# Patient Record
Sex: Female | Born: 1955 | Race: White | Hispanic: No | Marital: Married | Smoking: Never smoker
Health system: Southern US, Community
[De-identification: ages and names within clinical notes are randomized; demographics above are authoritative.]

## PROBLEM LIST (undated history)

## (undated) DIAGNOSIS — R030 Elevated blood-pressure reading, without diagnosis of hypertension: Secondary | ICD-10-CM

## (undated) DIAGNOSIS — IMO0001 Reserved for inherently not codable concepts without codable children: Secondary | ICD-10-CM

## (undated) DIAGNOSIS — J309 Allergic rhinitis, unspecified: Secondary | ICD-10-CM

## (undated) DIAGNOSIS — B019 Varicella without complication: Secondary | ICD-10-CM

## (undated) DIAGNOSIS — E7800 Pure hypercholesterolemia, unspecified: Secondary | ICD-10-CM

## (undated) DIAGNOSIS — G43909 Migraine, unspecified, not intractable, without status migrainosus: Secondary | ICD-10-CM

## (undated) DIAGNOSIS — I10 Essential (primary) hypertension: Secondary | ICD-10-CM

## (undated) HISTORY — DX: Reserved for inherently not codable concepts without codable children: IMO0001

## (undated) HISTORY — DX: Elevated blood-pressure reading, without diagnosis of hypertension: R03.0

## (undated) HISTORY — DX: Varicella without complication: B01.9

## (undated) HISTORY — DX: Migraine, unspecified, not intractable, without status migrainosus: G43.909

## (undated) HISTORY — DX: Allergic rhinitis, unspecified: J30.9

## (undated) HISTORY — DX: Essential (primary) hypertension: I10

## (undated) HISTORY — DX: Pure hypercholesterolemia, unspecified: E78.00

---

## 1992-07-27 HISTORY — PX: TUBAL LIGATION: SHX77

## 1998-07-27 HISTORY — PX: AUGMENTATION MAMMAPLASTY: SUR837

## 1998-07-27 HISTORY — PX: BREAST ENHANCEMENT SURGERY: SHX7

## 2013-11-20 ENCOUNTER — Other Ambulatory Visit (HOSPITAL_COMMUNITY)
Admission: RE | Admit: 2013-11-20 | Discharge: 2013-11-20 | Disposition: A | Discharge status: Home / Self Care | Payer: BC Managed Care – PPO | Source: Ambulatory Visit | Attending: Family Medicine | Admitting: Family Medicine

## 2013-11-20 DIAGNOSIS — Z01419 Encounter for gynecological examination (general) (routine) without abnormal findings: Secondary | ICD-10-CM | POA: Insufficient documentation

## 2013-11-23 LAB — HM PAP SMEAR: HM Pap smear: NEGATIVE

## 2013-12-13 LAB — HM COLONOSCOPY

## 2014-01-09 ENCOUNTER — Other Ambulatory Visit: Payer: Self-pay | Admitting: Family

## 2014-01-09 DIAGNOSIS — Z1231 Encounter for screening mammogram for malignant neoplasm of breast: Secondary | ICD-10-CM

## 2015-08-28 ENCOUNTER — Ambulatory Visit (INDEPENDENT_AMBULATORY_CARE_PROVIDER_SITE_OTHER): Payer: 59 | Admitting: Family Medicine

## 2015-08-28 ENCOUNTER — Encounter: Payer: Self-pay | Admitting: Family Medicine

## 2015-08-28 VITALS — BP 148/98 | HR 65 | Temp 97.8°F | Ht 66.5 in | Wt 194.8 lb

## 2015-08-28 DIAGNOSIS — G44229 Chronic tension-type headache, not intractable: Secondary | ICD-10-CM | POA: Diagnosis not present

## 2015-08-28 DIAGNOSIS — I10 Essential (primary) hypertension: Secondary | ICD-10-CM | POA: Insufficient documentation

## 2015-08-28 DIAGNOSIS — Z8041 Family history of malignant neoplasm of ovary: Secondary | ICD-10-CM | POA: Diagnosis not present

## 2015-08-28 DIAGNOSIS — IMO0001 Reserved for inherently not codable concepts without codable children: Secondary | ICD-10-CM

## 2015-08-28 DIAGNOSIS — L988 Other specified disorders of the skin and subcutaneous tissue: Secondary | ICD-10-CM | POA: Diagnosis not present

## 2015-08-28 DIAGNOSIS — R030 Elevated blood-pressure reading, without diagnosis of hypertension: Secondary | ICD-10-CM

## 2015-08-28 NOTE — Patient Instructions (Signed)
Nice to meet you. We will check lab work today. We will refer you to gynecology for further evaluation given your family history of cancer.  I would like for you to check your blood pressure daily and keep a log. You will return in 1 week for a nurse visit for blood pressure check.  If you develop persistent headaches, changing headaches, numbness, weakness, vision changes, chest pain, shortness of breath, palpitations, or any new or change in symptoms please seek medical attention.

## 2015-08-28 NOTE — Assessment & Plan Note (Signed)
Headache history consistent with chronic and intermittent tension headaches. Neurologically intact. Unchanged in recent years. Resolves with Excedrin. Discussed that these are likely tension headaches. We'll continue to monitor. Given return precautions.

## 2015-08-28 NOTE — Progress Notes (Signed)
Patient ID: Nichole Wilkins, female   DOB: 23-May-1956, 60 y.o.   MRN: [id number]  Marikay Alar, MD Phone: [phone]  Nichole Wilkins is a 60 y.o. female who presents today for new patient visit.  Elevated blood pressure: Patient notes blood pressure has been elevated intermittently for the last year. It's her blood pressure was never greater than 140/90. She has not checked it in several months. She not ever been on medications for this. She denies chest pain, shortness of breath, and edema.  She notes intermittent headaches that are tension in nature. Occurs when she is stressed. They are a dull ache above her right eye. It goes away easily with Excedrin. Do not wake her from sleep. No recent changes in the headaches. She has a history of migraines though has not had one in 3-4 years. Denies numbness, weakness, and vision changes.  She notes some stressors with her family life. She notes her sister was just diagnosed with ovarian cancer. Her mom had vulvar cancer in the past. Her grandmother died of some kind of cancer. She also notes her father had an MI in his 56s and her mother had an MI in her 14s. She denies depression and anxiety.  She notes a polyp in the left inner aspect of her mouth. It has been there about a year. She saw a dentist previously for a broken tooth and said that the polyp should go down with fixing the tooth. It has not improved. It has not grown. It does not cause any pain.  Active Ambulatory Problems    Diagnosis Date Noted  . Elevated blood pressure (not hypertension) 08/28/2015  . Chronic tension headaches 08/28/2015  . Family history of ovarian cancer 08/28/2015  . Polyp of skin 08/28/2015   Resolved Ambulatory Problems    Diagnosis Date Noted  . No Resolved Ambulatory Problems   Past Medical History  Diagnosis Date  . Chickenpox   . Allergic rhinitis   . Elevated blood pressure   . High cholesterol   . Migraines     Family History  Problem  Relation Age of Onset  . Ovarian cancer Sister   . Cancer - Other Mother     Vulvar   . Cancer - Other Maternal Grandmother     Unknown  . Stroke      Parent  . Heart attack Father     Age 58  . High blood pressure      Parent, other relative    Social History   Social History  . Marital Status: Married    Spouse Name: N/A  . Number of Children: N/A  . Years of Education: N/A   Occupational History  . Not on file.   Social History Main Topics  . Smoking status: Never Smoker   . Smokeless tobacco: Not on file  . Alcohol Use: No  . Drug Use: No  . Sexual Activity: Not on file   Other Topics Concern  . Not on file   Social History Narrative  . No narrative on file    ROS   General:  Negative for nexplained weight loss, fever Skin: Negative for new or changing mole, sore that won't heal HEENT: Negative for trouble hearing, trouble seeing, ringing in ears, mouth sores, hoarseness, change in voice, dysphagia. CV:  Negative for chest pain, dyspnea, edema, palpitations Resp: Negative for cough, dyspnea, hemoptysis GI: Positive for constipation, Negative for nausea, vomiting, diarrhea,  abdominal pain, melena, hematochezia. GU: Negative for dysuria, incontinence,  urinary hesitance, hematuria, vaginal or penile discharge, polyuria, sexual difficulty, lumps in testicle or breasts MSK: Positive for joint aches, Negative for muscle cramps or aches, joint swelling Neuro: Positive for headaches, Negative for weakness, numbness, dizziness, passing out/fainting Psych: Positive for stress, Negative for depression, anxiety, memory problems  Objective  Physical Exam Filed Vitals:   08/28/15 0829  BP: 148/98  Pulse: 65  Temp: 97.8 F (36.6 C)    BP Readings from Last 3 Encounters:  08/28/15 148/98   Wt Readings from Last 3 Encounters:  08/28/15 194 lb 12.8 oz (88.361 kg)    Physical Exam  Constitutional: She is well-developed, well-nourished, and in no distress.    HENT:  Head: Normocephalic and atraumatic.  Right Ear: External ear normal.  Left Ear: External ear normal.  Mouth/Throat: Oropharynx is clear and moist. No oropharyngeal exudate.  Eyes: Conjunctivae are normal. Pupils are equal, round, and reactive to light.  Neck: Neck supple.  Cardiovascular: Normal rate, regular rhythm and normal heart sounds.  Exam reveals no gallop and no friction rub.   No murmur heard. Pulmonary/Chest: Effort normal and breath sounds normal. No respiratory distress. She has no wheezes. She has no rales.  Abdominal: Soft. Bowel sounds are normal. She exhibits no distension. There is no tenderness. There is no rebound and no guarding.  Musculoskeletal: She exhibits no edema.  Lymphadenopathy:    She has no cervical adenopathy.  Neurological: She is alert.  CN 2-12 intact, 5/5 strength in bilateral biceps, triceps, grip, quads, hamstrings, plantar and dorsiflexion, sensation to light touch intact in bilateral UE and LE, normal gait, 2+ patellar reflexes  Skin: Skin is warm and dry. She is not diaphoretic.  Psychiatric: Mood and affect normal.     Assessment/Plan:   Elevated blood pressure (not hypertension) Blood pressure elevated today in the office. She reports over the last year it has been elevated at home, though never in the hypertensive range. She states never greater than 140/90 and notes that it was typically much lower than this though she is unable to give me exact numbers. I discussed that a single elevated blood pressure in the office did not necessarily indicate hypertension. Discussed that we would need to have multiple readings that revealed this type of blood pressure prior starting her on medication. She will check it at home and record it. She will return in 1 week for nurse visit for recheck. I advised that if it were consistently over 140/90 at home in the next several days she should call us and let us know and we can start on medication. She will  additionally return in 1 week for lab work as outlined below. If BP greater than 180/110 she will call immediately. She is given return precautions.  Chronic tension headaches Headache history consistent with chronic and intermittent tension headaches. Neurologically intact. Unchanged in recent years. Resolves with Excedrin. Discussed that these are likely tension headaches. We'll continue to monitor. Given return precautions.  Family history of ovarian cancer Notes strong family history of gynecologic cancer. Also with an unknown type of cancer in her maternal grandmother. Given this we will refer to gynecology for consideration of genetic testing for cancer risk.  Polyp of skin Polyp in left oral cavity. Has been evaluated by a dentist previously. Advised patient to return to dentist for possible removal and pathology.    Orders Placed This Encounter  Procedures  . Lipid Profile    Standing Status: Future     Number of  Occurrences:      Standing Expiration Date: 08/27/2016  . HgB A1c    Standing Status: Future     Number of Occurrences:      Standing Expiration Date: 08/27/2016  . Comp Met (CMET)    Standing Status: Future     Number of Occurrences:      Standing Expiration Date: 08/27/2016  . TSH    Standing Status: Future     Number of Occurrences:      Standing Expiration Date: 08/27/2016  . CBC    Standing Status: Future     Number of Occurrences:      Standing Expiration Date: 08/27/2016  . Ambulatory referral to Gynecology    Referral Priority:  Routine    Referral Type:  Consultation    Referral Reason:  Specialty Services Required    Requested Specialty:  Gynecology    Number of Visits Requested:  1    No orders of the defined types were placed in this encounter.     Tommi Rumps

## 2015-08-28 NOTE — Assessment & Plan Note (Signed)
Polyp in left oral cavity. Has been evaluated by a dentist previously. Advised patient to return to dentist for possible removal and pathology.

## 2015-08-28 NOTE — Progress Notes (Signed)
Pre visit review using our clinic review tool, if applicable. No additional management support is needed unless otherwise documented below in the visit note. 

## 2015-08-28 NOTE — Assessment & Plan Note (Signed)
Blood pressure elevated today in the office. She reports over the last year it has been elevated at home, though never in the hypertensive range. She states never greater than 140/90 and notes that it was typically much lower than this though she is unable to give me exact numbers. I discussed that a single elevated blood pressure in the office did not necessarily indicate hypertension. Discussed that we would need to have multiple readings that revealed this type of blood pressure prior starting her on medication. She will check it at home and record it. She will return in 1 week for nurse visit for recheck. I advised that if it were consistently over 140/90 at home in the next several days she should call us and let us know and we can start on medication. She will additionally return in 1 week for lab work as outlined below. If BP greater than 180/110 she will call immediately. She is given return precautions.

## 2015-08-28 NOTE — Assessment & Plan Note (Signed)
Notes strong family history of gynecologic cancer. Also with an unknown type of cancer in her maternal grandmother. Given this we will refer to gynecology for consideration of genetic testing for cancer risk.

## 2015-09-11 ENCOUNTER — Ambulatory Visit (INDEPENDENT_AMBULATORY_CARE_PROVIDER_SITE_OTHER): Payer: 59

## 2015-09-11 ENCOUNTER — Other Ambulatory Visit (INDEPENDENT_AMBULATORY_CARE_PROVIDER_SITE_OTHER): Payer: 59

## 2015-09-11 VITALS — BP 142/80 | HR 67 | Resp 18

## 2015-09-11 DIAGNOSIS — IMO0001 Reserved for inherently not codable concepts without codable children: Secondary | ICD-10-CM

## 2015-09-11 DIAGNOSIS — R030 Elevated blood-pressure reading, without diagnosis of hypertension: Secondary | ICD-10-CM

## 2015-09-11 LAB — COMPREHENSIVE METABOLIC PANEL
ALT: 20 U/L (ref 0–35)
AST: 23 U/L (ref 0–37)
Albumin: 4.2 g/dL (ref 3.5–5.2)
Alkaline Phosphatase: 64 U/L (ref 39–117)
BUN: 14 mg/dL (ref 6–23)
CO2: 27 mEq/L (ref 19–32)
Calcium: 9.3 mg/dL (ref 8.4–10.5)
Chloride: 106 mEq/L (ref 96–112)
Creatinine, Ser: 0.88 mg/dL (ref 0.40–1.20)
GFR: 70.13 mL/min (ref >60.00)
GLUCOSE: 86 mg/dL (ref 70–99)
Potassium: 4.1 mEq/L (ref 3.5–5.1)
Sodium: 140 mEq/L (ref 135–145)
Total Bilirubin: 0.3 mg/dL (ref 0.2–1.2)
Total Protein: 7.2 g/dL (ref 6.0–8.3)

## 2015-09-11 LAB — CBC
HEMATOCRIT: 38.5 % (ref 36.0–46.0)
HEMOGLOBIN: 13 g/dL (ref 12.0–15.0)
MCHC: 33.8 g/dL (ref 30.0–36.0)
MCV: 92.7 fl (ref 78.0–100.0)
PLATELETS: 245 10*3/uL (ref 150.0–400.0)
RBC: 4.15 Mil/uL (ref 3.87–5.11)
RDW: 13.2 % (ref 11.5–15.5)
WBC: 5.7 10*3/uL (ref 4.0–10.5)

## 2015-09-11 LAB — LIPID PANEL
Cholesterol: 208 mg/dL — ABNORMAL HIGH (ref 0–200)
HDL: 52.3 mg/dL (ref >39.00)
LDL CALC: 138 mg/dL — AB (ref 0–99)
NonHDL: 155.79
Total CHOL/HDL Ratio: 4
Triglycerides: 87 mg/dL (ref 0.0–149.0)
VLDL: 17.4 mg/dL (ref 0.0–40.0)

## 2015-09-11 LAB — TSH: TSH: 1.56 u[IU]/mL (ref 0.35–4.50)

## 2015-09-11 LAB — HEMOGLOBIN A1C: Hgb A1c MFr Bld: 5.5 % (ref 4.6–6.5)

## 2015-09-11 MED ORDER — AMLODIPINE BESYLATE 5 MG PO TABS: 5.0000 mg | ORAL_TABLET | Freq: Every day | ORAL | Status: DC

## 2015-09-11 NOTE — Addendum Note (Signed)
Addended by: Glori Luis on: 09/11/2015 04:55 PM   Modules accepted: Orders

## 2015-09-11 NOTE — Progress Notes (Signed)
Patient came in for BP check one week post office visit.  Per the patient she is checking BP at home, at random times, readings have been anywhere from 140-93 to 142/80 at home.  Checked vitals in bilateral extremities.  See vitals for details.  Please advise.

## 2015-09-11 NOTE — Progress Notes (Signed)
Patient ID: Nichole Wilkins, female   DOB: 1956-05-29, 60 y.o.   MRN: [id number] Amlodipine 5 mg daily sent to the pharmacy. Patient should monitor blood pressure at home on this. If it is less than 100/60 she should let us know.

## 2015-09-11 NOTE — Progress Notes (Signed)
Patient here today for lab draw only. 

## 2015-09-11 NOTE — Progress Notes (Signed)
Patient ID: Nichole Wilkins, female   DOB: 1956/06/27, 60 y.o.   MRN: [id number] Patient blood pressure still appears to be above goal slightly. There are 2 options. She needs to work on diet and exercise. This needs to occur regardless of being on medication. The other option is to start medication as well as work on diet and exercise. We would likely start low dose amlodipine. Please see what she would like to do.

## 2015-10-04 ENCOUNTER — Ambulatory Visit (INDEPENDENT_AMBULATORY_CARE_PROVIDER_SITE_OTHER): Payer: 59 | Admitting: Family Medicine

## 2015-10-04 ENCOUNTER — Encounter: Payer: Self-pay | Admitting: Family Medicine

## 2015-10-04 VITALS — BP 124/82 | HR 69 | Temp 97.5°F | Ht 66.5 in | Wt 187.0 lb

## 2015-10-04 DIAGNOSIS — R21 Rash and other nonspecific skin eruption: Secondary | ICD-10-CM | POA: Diagnosis not present

## 2015-10-04 DIAGNOSIS — E785 Hyperlipidemia, unspecified: Secondary | ICD-10-CM | POA: Insufficient documentation

## 2015-10-04 DIAGNOSIS — I10 Essential (primary) hypertension: Secondary | ICD-10-CM | POA: Diagnosis not present

## 2015-10-04 MED ORDER — HYDROCHLOROTHIAZIDE 25 MG PO TABS: 12.5000 mg | ORAL_TABLET | Freq: Every day | ORAL | Status: DC

## 2015-10-04 MED ORDER — TRIAMCINOLONE ACETONIDE 0.1 % EX CREA: 1.0000 "application " | TOPICAL_CREAM | Freq: Two times a day (BID) | CUTANEOUS | Status: DC | PRN

## 2015-10-04 NOTE — Patient Instructions (Signed)
Nice to see you. You blood pressure is doing great.  We will change your blood pressure medication given the rash you have developed. We will start you on HCTZ 12.5 mg daily.  Please use the steroid ointment for the rash.  If the rash spreads, you develop fever, pain, do not feel well, or any new or change in symptoms please seek medical attention.

## 2015-10-04 NOTE — Assessment & Plan Note (Signed)
Patient with a rash onset after starting amlodipine. No other potential causes identified. No systemic symptoms. Vital signs are stable. We will change her amlodipine and hydrochlorothiazide. She will stop amlodipine. We will treat the rash with triamcinolone ointment. She'll continue to monitor. She's given return precautions.

## 2015-10-04 NOTE — Assessment & Plan Note (Addendum)
Blood pressure elevated on multiple checks about a month and a half ago. Patient was started on amlodipine. Blood pressure is now in the normal range. Rash could be related to amlodipine. Thus we will change to hydrochlorothiazide and stop the amlodipine. Continue to monitor her blood pressure at home. She will return in one month for blood pressure recheck and lab work.

## 2015-10-04 NOTE — Assessment & Plan Note (Signed)
Mildly elevated total cholesterol and LDL cholesterol previously. Discussed diet and exercise at length with patient. She will continue to work on diet and to exercise daily. We'll plan to recheck her cholesterol in one month.

## 2015-10-04 NOTE — Progress Notes (Signed)
Pre visit review using our clinic review tool, if applicable. No additional management support is needed unless otherwise documented below in the visit note. 

## 2015-10-04 NOTE — Progress Notes (Signed)
Patient ID: Nichole Wilkins, female   DOB: 07/09/1956, 60 y.o.   MRN: [id number]  Marikay AlarEric Jaelynne Hockley, MD Phone: [phone]  Nichole IgoSandra L Wilkins is a 60 y.o. female who presents today for follow-up.  HYPERTENSION Disease Monitoring Home BP Monitoring checking, patient reports they're in the normal range, unable to give specific numbers Chest pain- no    Dyspnea- no Medications Compliance-  taking amlodipine 5 mg daily.  Edema- no  HYPERLIPIDEMIA Disease Monitoring: See symptoms for Hypertension Currently diet controlled. She is working on cutting back on fried foods and is exercising by walking on the treadmill 35 minutes a day at almost a running paste.  Rash: Patient notes rash over her upper chest and shoulders for the last 2-3 weeks. Notes this started about a week after starting on amlodipine. She notes it itches. It has not spread. She has not tried any medications for it. No fevers. No contacts with this. No travel. Notes only new medication is amlodipine. Notes no new soaps or detergents. Notes it has not improved. No rash elsewhere.   PMH: nonsmoker.   ROS see HPI  Objective  Physical Exam Filed Vitals:   10/04/15 0801  BP: 124/82  Pulse: 69  Temp: 97.5 F (36.4 C)    BP Readings from Last 3 Encounters:  10/04/15 124/82  09/11/15 142/80  08/28/15 148/98   Wt Readings from Last 3 Encounters:  10/04/15 187 lb (84.823 kg)  08/28/15 194 lb 12.8 oz (88.361 kg)    Physical Exam  Constitutional: She is well-developed, well-nourished, and in no distress.  HENT:  Head: Normocephalic and atraumatic.  Right Ear: External ear normal.  Left Ear: External ear normal.  Mouth/Throat: No oropharyngeal exudate.  Cardiovascular: Normal rate, regular rhythm and normal heart sounds.  Exam reveals no gallop and no friction rub.   No murmur heard. Pulmonary/Chest: Effort normal and breath sounds normal. No respiratory distress. She has no wheezes. She has no rales.    Neurological: She is alert. Gait normal.  Skin: Skin is warm and dry. She is not diaphoretic.     Erythematous papules with excoriations noted over upper chest with minimal area of rash on her shoulders anteriorly, non-tender, no warmth, no induration, no fluctuance     Assessment/Plan: Please see individual problem list.  Hypertension Blood pressure elevated on multiple checks about a month and a half ago. Patient was started on amlodipine. Blood pressure is now in the normal range. Rash could be related to amlodipine. Thus we will change to hydrochlorothiazide and stop the amlodipine. Continue to monitor her blood pressure at home. She will return in one month for blood pressure recheck and lab work.  Hyperlipidemia Mildly elevated total cholesterol and LDL cholesterol previously. Discussed diet and exercise at length with patient. She will continue to work on diet and to exercise daily. We'll plan to recheck her cholesterol in one month.  Rash and nonspecific skin eruption Patient with a rash onset after starting amlodipine. No other potential causes identified. No systemic symptoms. Vital signs are stable. We will change her amlodipine and hydrochlorothiazide. She will stop amlodipine. We will treat the rash with triamcinolone ointment. She'll continue to monitor. She's given return precautions.    Orders Placed This Encounter  Procedures  . Direct LDL    Standing Status: Future     Number of Occurrences:      Standing Expiration Date: 10/03/2016  . Basic Metabolic Panel (BMET)    Standing Status: Future  Number of Occurrences:      Standing Expiration Date: 10/03/2016    Meds ordered this encounter  Medications  . hydrochlorothiazide (HYDRODIURIL) 25 MG tablet    Sig: Take 0.5 tablets (12.5 mg total) by mouth daily.    Dispense:  45 tablet    Refill:  3  . triamcinolone cream (KENALOG) 0.1 %    Sig: Apply 1 application topically 2 (two) times daily as needed. For Rash  and itching.    Dispense:  30 g    Refill:  0    Marikay Alar, MD [facility]

## 2015-10-09 ENCOUNTER — Encounter: Payer: Self-pay | Admitting: Obstetrics and Gynecology

## 2015-10-15 ENCOUNTER — Encounter: Payer: Self-pay | Admitting: Obstetrics and Gynecology

## 2015-10-15 ENCOUNTER — Ambulatory Visit (INDEPENDENT_AMBULATORY_CARE_PROVIDER_SITE_OTHER): Payer: 59 | Admitting: Obstetrics and Gynecology

## 2015-10-15 VITALS — BP 130/73 | HR 69 | Ht 66.5 in | Wt 187.8 lb

## 2015-10-15 DIAGNOSIS — Z780 Asymptomatic menopausal state: Secondary | ICD-10-CM | POA: Diagnosis not present

## 2015-10-15 DIAGNOSIS — Z809 Family history of malignant neoplasm, unspecified: Secondary | ICD-10-CM | POA: Diagnosis not present

## 2015-10-15 DIAGNOSIS — Z8041 Family history of malignant neoplasm of ovary: Secondary | ICD-10-CM | POA: Diagnosis not present

## 2015-10-15 DIAGNOSIS — Z803 Family history of malignant neoplasm of breast: Secondary | ICD-10-CM | POA: Diagnosis not present

## 2015-10-15 DIAGNOSIS — Z808 Family history of malignant neoplasm of other organs or systems: Secondary | ICD-10-CM | POA: Diagnosis not present

## 2015-10-15 NOTE — Progress Notes (Signed)
GYNECOLOGY PROGRESS NOTE  Subjective:    Patient ID: Nichole Wilkins, female    DOB: 1955-10-28, 60 y.o.   MRN: [id number]  HPI  Patient is a 60 y.o. [id number]  female who presents as a referral from [facility] for consultation secondary to significant family history of cancers.  Patient currently denies complaints.    Gynecology History:   Last pap smear 2 years ago, normal.  No h/o abnormal pap smears. Last mammogram 2 years ago (performed in [place]).   Past Medical History  Diagnosis Date  . Chickenpox   . Allergic rhinitis   . Elevated blood pressure   . High cholesterol   . Migraines   . Hypertension     Family History  Problem Relation Age of Onset  . Ovarian cancer Sister 78  . Cancer - Other Mother 43    Vulvar   . Cancer - Other Son 30    Testicular  . Breast cancer Maternal Grandmother   . Stroke      Parent  . Heart attack Father 61  . High blood pressure      Parent, other relative  . Cancer - Other Paternal Grandfather     unknown  . Cervical cancer Sister 30    Past Surgical History  Procedure Laterality Date  . Tubal ligation  1994  . Breast enhancement surgery  2000    Social History   Social History  . Marital Status: Married    Spouse Name: N/A  . Number of Children: N/A  . Years of Education: N/A   Occupational History  . Not on file.   Social History Main Topics  . Smoking status: Never Smoker   . Smokeless tobacco: Not on file  . Alcohol Use: No  . Drug Use: No  . Sexual Activity: Yes    Birth Control/ Protection: Post-menopausal   Other Topics Concern  . Not on file   Social History Narrative    Current Outpatient Prescriptions on File Prior to Visit  Medication Sig Dispense Refill  . hydrochlorothiazide (HYDRODIURIL) 25 MG tablet Take 0.5 tablets (12.5 mg total) by mouth daily. 45 tablet 3  . triamcinolone cream (KENALOG) 0.1 % Apply 1 application topically 2 (two) times daily as needed. For Rash and  itching. 30 g 0   No current facility-administered medications on file prior to visit.    Allergies  Allergen Reactions  . Amlodipine Rash    Review of Systems A comprehensive review of systems was negative.   Objective:   Blood pressure 130/73, pulse 69, height 5' 6.5" (1.689 m), weight 187 lb 12.8 oz (85.186 kg).   Exam deferred today.    Assessment:   Strong family history of cancer  Plan:   - Discussion had with patient regarding significant family history of several different cancers. Based on types of cancers noted, would recommend Myriad extended panel for genetic screening for hereditary cancers.  Discussed implications of genetic testing if positive and possible need for further management which could involve medications or surgery.  Patient notes understanding, agrees to testing. Tyrer-Cuzick model risk score calculator with 10 year risk of hereditary breast/ovarian cancer syndrome is 5.7% (population risk is 3.5%). Lifetime risk is 15% (population risk is 9.5%).  Calculated risk index form will be scanned into chart.  - Discussion had with patient regarding current routine screening recommendations. Based on family history and current age, would recommend mammogram screening at least yearly (instead of q 2  years).  Would also recommend colonosocopy or sigmoidoscopy if this has not already been completed.   Patient to f/u in 2 weeks for discussion of results.    A total of 30 minutes were spent face-to-face with the patient during the encounter and over half of that time involved counseling and coordination of care.    Rubie Maid, MD [facility]

## 2015-10-18 ENCOUNTER — Encounter: Payer: Self-pay | Admitting: Obstetrics and Gynecology

## 2015-10-30 ENCOUNTER — Ambulatory Visit (INDEPENDENT_AMBULATORY_CARE_PROVIDER_SITE_OTHER): Payer: 59 | Admitting: Obstetrics and Gynecology

## 2015-10-30 VITALS — BP 128/76 | HR 73 | Ht 66.5 in | Wt 189.7 lb

## 2015-10-30 DIAGNOSIS — Z809 Family history of malignant neoplasm, unspecified: Secondary | ICD-10-CM | POA: Diagnosis not present

## 2015-11-01 NOTE — Progress Notes (Signed)
    GYNECOLOGY PROGRESS NOTE  Subjective:    Patient ID: Nichole Wilkins, female    DOB: April 26, 1956, 60 y.o.   MRN: [id number]  HPI  Patient is a 60 y.o. [id number] female who presents for follow up discussion of Myriad MyRisk results for family history of breast/ovarian and other cancers. Patient denies complaints today.   The following portions of the patient's history were reviewed and updated as appropriate: allergies, current medications, past family history, past medical history, past social history, past surgical history and problem list.   Family History  Problem Relation Age of Onset  . Ovarian cancer Sister 107  . Cancer - Other Mother 57    Vulvar   . Cancer - Other Son 61    Testicular  . Breast cancer Maternal Grandmother   . Stroke      Parent  . Heart attack Father 78  . High blood pressure      Parent, other relative  . Cancer - Other Paternal Grandfather     unknown  . Cervical cancer Sister 22    Review of Systems Pertinent items noted in HPI and remainder of comprehensive ROS otherwise negative.   Objective:   Blood pressure 128/76, pulse 73, height 5' 6.5" (1.689 m), weight 189 lb 11.2 oz (86.047 kg). General appearance: alert and no distress Remainder of exam deferred.    Assessment:   Family history of cancer  Plan:   Discussion had with patient regarding test results. Myriad testing negative, no clinically significant mutation or variants of uncertain significance identified.  Patient does not require any additional screening beyond recommended routine breast cancer/cervical cancer/colon cancer screening.  Discussed with patient that she should inform other family members of testing, particularly her sister who had ovarian cancer, as she may have children who could be potentially be affected.  Patient notes understanding.   All questions answered. Myriad test results scanned in to [facility].  Will also send today's encounter note to referring PCP.    A  total of 15 minutes were spent face-to-face with the patient during this encounter and over half of that time dealt with counseling and coordination of care.    Rubie Maid, MD [facility]

## 2015-11-05 ENCOUNTER — Other Ambulatory Visit (INDEPENDENT_AMBULATORY_CARE_PROVIDER_SITE_OTHER): Payer: 59

## 2015-11-05 ENCOUNTER — Ambulatory Visit (INDEPENDENT_AMBULATORY_CARE_PROVIDER_SITE_OTHER): Payer: 59

## 2015-11-05 VITALS — BP 126/86 | HR 71

## 2015-11-05 DIAGNOSIS — I10 Essential (primary) hypertension: Secondary | ICD-10-CM

## 2015-11-05 DIAGNOSIS — E785 Hyperlipidemia, unspecified: Secondary | ICD-10-CM | POA: Diagnosis not present

## 2015-11-05 LAB — BASIC METABOLIC PANEL
BUN: 18 mg/dL (ref 6–23)
CHLORIDE: 105 meq/L (ref 96–112)
CO2: 29 meq/L (ref 19–32)
Calcium: 9.5 mg/dL (ref 8.4–10.5)
Creatinine, Ser: 0.91 mg/dL (ref 0.40–1.20)
GFR: 67.44 mL/min (ref >60.00)
Glucose, Bld: 83 mg/dL (ref 70–99)
POTASSIUM: 4.2 meq/L (ref 3.5–5.1)
SODIUM: 141 meq/L (ref 135–145)

## 2015-11-05 LAB — LDL CHOLESTEROL, DIRECT: LDL DIRECT: 153 mg/dL

## 2015-11-05 NOTE — Progress Notes (Signed)
Patient was in the office getting a repeat on her BP since starting a new medication. Patient is not having any problems with the current medication has of this time. Patient toelrated well.

## 2015-11-07 ENCOUNTER — Other Ambulatory Visit: Payer: Self-pay | Admitting: Family Medicine

## 2015-11-07 MED ORDER — ATORVASTATIN CALCIUM 20 MG PO TABS: 20.0000 mg | ORAL_TABLET | Freq: Every day | ORAL | Status: DC

## 2015-11-11 ENCOUNTER — Encounter: Payer: Self-pay | Admitting: Obstetrics and Gynecology

## 2015-11-11 NOTE — Progress Notes (Signed)
Blood pressures are at goal. Continue current medication.  Marikay AlarEric Skilynn Durney, M.D.

## 2016-05-19 DIAGNOSIS — K136 Irritative hyperplasia of oral mucosa: Secondary | ICD-10-CM | POA: Diagnosis not present

## 2016-05-26 DIAGNOSIS — K136 Irritative hyperplasia of oral mucosa: Secondary | ICD-10-CM | POA: Diagnosis not present

## 2016-06-02 DIAGNOSIS — K136 Irritative hyperplasia of oral mucosa: Secondary | ICD-10-CM | POA: Diagnosis not present

## 2017-05-13 DIAGNOSIS — H5203 Hypermetropia, bilateral: Secondary | ICD-10-CM | POA: Diagnosis not present

## 2017-12-27 ENCOUNTER — Telehealth: Payer: Self-pay

## 2017-12-27 DIAGNOSIS — I10 Essential (primary) hypertension: Secondary | ICD-10-CM

## 2017-12-27 DIAGNOSIS — E785 Hyperlipidemia, unspecified: Secondary | ICD-10-CM

## 2017-12-27 NOTE — Telephone Encounter (Signed)
Copied from CRM [phone]#[id number]. Topic: General - Other >> Dec 27, 2017 11:19 AM Elliot GaultBell, Tiffany M wrote: Relation to pt: self Call back number:[phone]  Reason for call:  Patient received a VM [facility] to Dr. Birdie SonsSonnenberg not being in the office, patient states the only day that would work for her would be 6/20 6/21 or 6/24, patient states she needs appointment before 7/1 due to [employer] insurance, pleas advise >> Dec 27, 2017 11:23 AM Elliot GaultBell, Tiffany M wrote: Relation to pt: self Call back [phone]  Reason for call:  Patient received [facility] VM [facility] Dr. Birdie SonsSonnenberg not being in the office, patient states the only day that would work for her would be 6/20 6/21 or 6/24, patient states she needs appointment before 7/1 due to [employer] insurance, pleas advise

## 2017-12-27 NOTE — Telephone Encounter (Signed)
Ok to place in at 4:30 on 01/14/18.

## 2017-12-27 NOTE — Telephone Encounter (Signed)
Please advise where to schedule.

## 2017-12-28 NOTE — Telephone Encounter (Signed)
Patient notified and scheduled, patient requested to have fasting labs done prior. I have scheduled patient for lab appointment 2 days prior to CPE. Please place orders.

## 2017-12-28 NOTE — Telephone Encounter (Signed)
Orders placed.

## 2017-12-28 NOTE — Addendum Note (Signed)
Addended by: Glori LuisSONNENBERG, Regana Kemple G on: 12/28/2017 10:35 AM   Modules accepted: Orders

## 2018-01-12 ENCOUNTER — Other Ambulatory Visit (INDEPENDENT_AMBULATORY_CARE_PROVIDER_SITE_OTHER): Payer: 59

## 2018-01-12 DIAGNOSIS — E785 Hyperlipidemia, unspecified: Secondary | ICD-10-CM

## 2018-01-12 DIAGNOSIS — I10 Essential (primary) hypertension: Secondary | ICD-10-CM

## 2018-01-12 LAB — COMPREHENSIVE METABOLIC PANEL
ALT: 72 U/L — ABNORMAL HIGH (ref 0–35)
AST: 48 U/L — ABNORMAL HIGH (ref 0–37)
Albumin: 4.2 g/dL (ref 3.5–5.2)
Alkaline Phosphatase: 81 U/L (ref 39–117)
BUN: 12 mg/dL (ref 6–23)
CHLORIDE: 104 meq/L (ref 96–112)
CO2: 28 meq/L (ref 19–32)
Calcium: 9.5 mg/dL (ref 8.4–10.5)
Creatinine, Ser: 0.87 mg/dL (ref 0.40–1.20)
GFR: 70.5 mL/min (ref >60.00)
GLUCOSE: 88 mg/dL (ref 70–99)
POTASSIUM: 4.4 meq/L (ref 3.5–5.1)
SODIUM: 139 meq/L (ref 135–145)
Total Bilirubin: 0.5 mg/dL (ref 0.2–1.2)
Total Protein: 7.5 g/dL (ref 6.0–8.3)

## 2018-01-12 LAB — CBC
HEMATOCRIT: 37.2 % (ref 36.0–46.0)
Hemoglobin: 12.8 g/dL (ref 12.0–15.0)
MCHC: 34.3 g/dL (ref 30.0–36.0)
MCV: 93.8 fl (ref 78.0–100.0)
Platelets: 233 10*3/uL (ref 150.0–400.0)
RBC: 3.97 Mil/uL (ref 3.87–5.11)
RDW: 12.9 % (ref 11.5–15.5)
WBC: 5.5 10*3/uL (ref 4.0–10.5)

## 2018-01-12 LAB — LIPID PANEL
Cholesterol: 213 mg/dL — ABNORMAL HIGH (ref 0–200)
HDL: 46.5 mg/dL (ref >39.00)
LDL CALC: 134 mg/dL — AB (ref 0–99)
NONHDL: 166.36
Total CHOL/HDL Ratio: 5
Triglycerides: 162 mg/dL — ABNORMAL HIGH (ref 0.0–149.0)
VLDL: 32.4 mg/dL (ref 0.0–40.0)

## 2018-01-12 LAB — HEMOGLOBIN A1C: Hgb A1c MFr Bld: 5.7 % (ref 4.6–6.5)

## 2018-01-12 LAB — TSH: TSH: 1.51 u[IU]/mL (ref 0.35–4.50)

## 2018-01-14 ENCOUNTER — Encounter: Payer: 59 | Admitting: Family Medicine

## 2018-01-17 ENCOUNTER — Ambulatory Visit (INDEPENDENT_AMBULATORY_CARE_PROVIDER_SITE_OTHER): Payer: 59 | Admitting: Family Medicine

## 2018-01-17 ENCOUNTER — Encounter: Payer: Self-pay | Admitting: Family Medicine

## 2018-01-17 ENCOUNTER — Other Ambulatory Visit (HOSPITAL_COMMUNITY)
Admission: RE | Admit: 2018-01-17 | Discharge: 2018-01-17 | Disposition: A | Discharge status: Home / Self Care | Payer: 59 | Source: Ambulatory Visit | Attending: Family Medicine | Admitting: Family Medicine

## 2018-01-17 VITALS — BP 158/100 | HR 63 | Temp 98.2°F | Ht 66.0 in | Wt 197.2 lb

## 2018-01-17 DIAGNOSIS — N950 Postmenopausal bleeding: Secondary | ICD-10-CM | POA: Diagnosis not present

## 2018-01-17 DIAGNOSIS — E785 Hyperlipidemia, unspecified: Secondary | ICD-10-CM

## 2018-01-17 DIAGNOSIS — Z124 Encounter for screening for malignant neoplasm of cervix: Secondary | ICD-10-CM | POA: Insufficient documentation

## 2018-01-17 DIAGNOSIS — I10 Essential (primary) hypertension: Secondary | ICD-10-CM | POA: Diagnosis not present

## 2018-01-17 DIAGNOSIS — Z1231 Encounter for screening mammogram for malignant neoplasm of breast: Secondary | ICD-10-CM | POA: Diagnosis not present

## 2018-01-17 DIAGNOSIS — Z1159 Encounter for screening for other viral diseases: Secondary | ICD-10-CM | POA: Diagnosis not present

## 2018-01-17 DIAGNOSIS — Z0001 Encounter for general adult medical examination with abnormal findings: Secondary | ICD-10-CM | POA: Insufficient documentation

## 2018-01-17 DIAGNOSIS — Z1239 Encounter for other screening for malignant neoplasm of breast: Secondary | ICD-10-CM

## 2018-01-17 MED ORDER — ATORVASTATIN CALCIUM 20 MG PO TABS: 20.0000 mg | ORAL_TABLET | Freq: Every day | ORAL | 3 refills | Status: DC

## 2018-01-17 MED ORDER — HYDROCHLOROTHIAZIDE 12.5 MG PO TABS: 12.5000 mg | ORAL_TABLET | Freq: Every day | ORAL | 2 refills | Status: DC

## 2018-01-17 NOTE — Assessment & Plan Note (Signed)
Uncontrolled.  We will restart hydrochlorothiazide.  She will return in 4 weeks for BP check and labs.

## 2018-01-17 NOTE — Progress Notes (Signed)
Nichole Rumps, MD Phone: [phone]  Nichole Wilkins is a 62 y.o. female who presents today for cpe.    Not exercising. Diet has not been healthy.  She snacks due to boredom at night. Pap smear about 5 years ago. Colonoscopy May 2015.  They advised 10-year recall.  Will request records. She is unsure when her last mammogram was.  This has been ordered and she will schedule. No prior hepatitis C testing. Reports prior HIV testing in 2001 in [place]. Tetanus vaccination likely greater than 10 years. No tobacco use, alcohol use, or illicit drug use. She sees a Pharmacist, community twice a year.  Ophthalmologist once yearly.   She does report occasional vaginal spotting.  She is postmenopausal. She reports she underwent genetic counseling given family history of ovarian cancer and breast cancer. She reports she stopped her cholesterol and blood pressure medications when she lost weight last year.  She is put about 20 pounds back on and her blood pressure is gone back up.  Active Ambulatory Problems    Diagnosis Date Noted  . Hypertension 08/28/2015  . Chronic tension headaches 08/28/2015  . Family history of ovarian cancer 08/28/2015  . Polyp of skin 08/28/2015  . Hyperlipidemia 10/04/2015  . Rash and nonspecific skin eruption 10/04/2015  . Encounter for general adult medical examination with abnormal findings 01/17/2018  . Postmenopausal bleeding 01/17/2018   Resolved Ambulatory Problems    Diagnosis Date Noted  . No Resolved Ambulatory Problems   Past Medical History:  Diagnosis Date  . Allergic rhinitis   . Chickenpox   . Elevated blood pressure   . High cholesterol   . Hypertension   . Migraines     Family History  Problem Relation Age of Onset  . Ovarian cancer Sister 50  . Cancer - Other Mother 80       Vulvar   . Cervical cancer Sister 65  . Cancer - Other Son 44       Testicular  . Breast cancer Maternal Grandmother   . Stroke Unknown        Parent  . Heart attack  Father 80  . High blood pressure Unknown        Parent, other relative  . Cancer - Other Paternal Grandfather        unknown    Social History   Socioeconomic History  . Marital status: Married    Spouse name: Not on file  . Number of children: Not on file  . Years of education: Not on file  . Highest education level: Not on file  Occupational History  . Not on file  Social Needs  . Financial resource strain: Not on file  . Food insecurity:    Worry: Not on file    Inability: Not on file  . Transportation needs:    Medical: Not on file    Non-medical: Not on file  Tobacco Use  . Smoking status: Never Smoker  . Smokeless tobacco: Never Used  Substance and Sexual Activity  . Alcohol use: No    Alcohol/week: 0.0 oz  . Drug use: No  . Sexual activity: Yes    Birth control/protection: Post-menopausal  Lifestyle  . Physical activity:    Days per week: Not on file    Minutes per session: Not on file  . Stress: Not on file  Relationships  . Social connections:    Talks on phone: Not on file    Gets together: Not on file  Attends religious service: Not on file    Active member of club or organization: Not on file    Attends meetings of clubs or organizations: Not on file    Relationship status: Not on file  . Intimate partner violence:    Fear of current or ex partner: Not on file    Emotionally abused: Not on file    Physically abused: Not on file    Forced sexual activity: Not on file  Other Topics Concern  . Not on file  Social History Narrative  . Not on file    ROS  General:  Negative for nexplained weight loss, fever Skin: Negative for new or changing mole, sore that won't heal HEENT: Negative for trouble hearing, trouble seeing, ringing in ears, mouth sores, hoarseness, change in voice, dysphagia. CV:  Negative for chest pain, dyspnea, edema, palpitations Resp: Negative for cough, dyspnea, hemoptysis GI: Negative for nausea, vomiting, diarrhea,  constipation, abdominal pain, melena, hematochezia. GU: Negative for dysuria, incontinence, urinary hesitance, hematuria, vaginal or penile discharge, polyuria, sexual difficulty, lumps in testicle or breasts MSK: Negative for muscle cramps or aches, joint pain or swelling Neuro: Negative for headaches, weakness, numbness, dizziness, passing out/fainting Psych: Negative for depression, anxiety, memory problems  Objective  Physical Exam Vitals:   01/17/18 1621  BP: (!) 158/100  Pulse: 63  Temp: 98.2 F (36.8 C)  SpO2: 99%    BP Readings from Last 3 Encounters:  01/17/18 (!) 158/100  11/05/15 126/86  10/30/15 128/76   Wt Readings from Last 3 Encounters:  01/17/18 197 lb 3.2 oz (89.4 kg)  10/30/15 189 lb 11.2 oz (86 kg)  10/15/15 187 lb 12.8 oz (85.2 kg)    Physical Exam  Constitutional: No distress.  HENT:  Head: Normocephalic and atraumatic.  Mouth/Throat: Oropharynx is clear and moist.  Eyes: Pupils are equal, round, and reactive to light. Conjunctivae are normal.  Neck: Neck supple.  Cardiovascular: Normal rate, regular rhythm and normal heart sounds.  Pulmonary/Chest: Effort normal and breath sounds normal.  Abdominal: Soft. Bowel sounds are normal. She exhibits no distension and no mass. There is no tenderness. There is no rebound and no guarding.  Genitourinary:  Genitourinary Comments: Chaperone used, bilateral breast with no skin changes, masses, tenderness, or nipple inversion, no axillary masses bilaterally, normal labia, normal vaginal mucosa, cervix with possible polyp just inside the cervical os, no cervical motion tenderness, normal bimanual exam with no adnexal tenderness or masses  Musculoskeletal: She exhibits no edema.  Lymphadenopathy:    She has no cervical adenopathy.  Neurological: She is alert.  Skin: Skin is warm and dry. She is not diaphoretic.     Assessment/Plan:   Encounter for general adult medical examination with abnormal  findings Physical exam completed.  Discussed exercising starting with 2 days a week of walking and changing to nighttime meals per week.  Lab work reviewed.  Patient can get tetanus vaccination through employee health.  Hypertension Uncontrolled.  We will restart hydrochlorothiazide.  She will return in 4 weeks for BP check and labs.  Hyperlipidemia Restart Lipitor.  Check labs in 4 weeks.  Postmenopausal bleeding Possible polyp on exam which could be the cause of this though she needs evaluation with gynecology to determine if there is a more significant cause.  I discussed this with the patient.   Orders Placed This Encounter  Procedures  . MM 3D SCREEN BREAST BILATERAL    Standing Status:   Future    Standing Expiration Date:  03/20/2019    Order Specific Question:   Reason for Exam (SYMPTOM  OR DIAGNOSIS REQUIRED)    Answer:   screening    Order Specific Question:   Is the patient pregnant?    Answer:   No    Order Specific Question:   Preferred imaging location?    Answer:   [facility]  . Comp Met (CMET)    Standing Status:   Future    Standing Expiration Date:   01/18/2019  . Hepatitis C antibody    Standing Status:   Future    Standing Expiration Date:   01/18/2019  . LDL cholesterol, direct    Standing Status:   Future    Standing Expiration Date:   01/18/2019  . Ambulatory referral to Gynecology    Referral Priority:   Routine    Referral Type:   Consultation    Referral Reason:   Specialty Services Required    Requested Specialty:   Gynecology    Number of Visits Requested:   1    Meds ordered this encounter  Medications  . hydrochlorothiazide (HYDRODIURIL) 12.5 MG tablet    Sig: Take 1 tablet (12.5 mg total) by mouth daily.    Dispense:  30 tablet    Refill:  2  . atorvastatin (LIPITOR) 20 MG tablet    Sig: Take 1 tablet (20 mg total) by mouth daily.    Dispense:  90 tablet    Refill:  3   The 10-year ASCVD risk score Mikey Bussing DC Jr., et al., 2013) is:  7.8%   Values used to calculate the score:     Age: 17 years     Sex: Female     Is Non-Hispanic African American: No     Diabetic: No     Tobacco smoker: No     Systolic Blood Pressure: 478 mmHg     Is BP treated: Yes     HDL Cholesterol: 46.5 mg/dL     Total Cholesterol: 213 mg/dL   Nichole Rumps, MD [place]

## 2018-01-17 NOTE — Assessment & Plan Note (Signed)
Possible polyp on exam which could be the cause of this though she needs evaluation with gynecology to determine if there is a more significant cause.  I discussed this with the patient.

## 2018-01-17 NOTE — Assessment & Plan Note (Signed)
Physical exam completed.  Discussed exercising starting with 2 days a week of walking and changing to nighttime meals per week.  Lab work reviewed.  Patient can get tetanus vaccination through employee health.

## 2018-01-17 NOTE — Assessment & Plan Note (Signed)
Restart Lipitor.  Check labs in 4 weeks.

## 2018-01-17 NOTE — Patient Instructions (Signed)
Nice to see you. Please work on diet and exercise changes as we discussed.  We will restart you on HCTZ for your blood pressure. We will have you return in 4 weeks for BP check and labs. We will restart you on lipitor. You will have your cholesterol rechecked in 4 weeks. We will refer you to GYN for evaluation of your vaginal spotting. You should be able to get the Tdap at employee health at the hospital.

## 2018-01-18 ENCOUNTER — Encounter: Payer: Self-pay | Admitting: Family Medicine

## 2018-01-19 ENCOUNTER — Telehealth: Payer: Self-pay

## 2018-01-19 ENCOUNTER — Encounter: Payer: 59 | Admitting: Family Medicine

## 2018-01-19 LAB — CYTOLOGY - PAP
DIAGNOSIS: NEGATIVE
HPV: NOT DETECTED

## 2018-01-19 NOTE — Telephone Encounter (Signed)
Left message to return call, also sent mychart message.

## 2018-01-19 NOTE — Telephone Encounter (Signed)
-----   Message from Glori LuisEric G Sonnenberg, MD sent at 01/17/2018  8:07 PM EDT ----- I believe I forgot to advise the patient that she could get the tetanus vaccination through employee health.  This should be a Tdap.  Please contact her to inform her of this.  Thanks.  Eric.

## 2018-01-25 ENCOUNTER — Encounter: Payer: Self-pay | Admitting: Family Medicine

## 2018-02-16 ENCOUNTER — Other Ambulatory Visit: Payer: 59

## 2018-02-17 ENCOUNTER — Other Ambulatory Visit: Payer: Self-pay | Admitting: *Deleted

## 2018-02-17 ENCOUNTER — Other Ambulatory Visit (INDEPENDENT_AMBULATORY_CARE_PROVIDER_SITE_OTHER): Payer: 59

## 2018-02-17 ENCOUNTER — Ambulatory Visit (INDEPENDENT_AMBULATORY_CARE_PROVIDER_SITE_OTHER): Payer: 59

## 2018-02-17 VITALS — BP 118/82 | HR 70

## 2018-02-17 DIAGNOSIS — Z1159 Encounter for screening for other viral diseases: Secondary | ICD-10-CM

## 2018-02-17 DIAGNOSIS — I10 Essential (primary) hypertension: Secondary | ICD-10-CM

## 2018-02-17 DIAGNOSIS — E785 Hyperlipidemia, unspecified: Secondary | ICD-10-CM

## 2018-02-17 LAB — COMPREHENSIVE METABOLIC PANEL
ALK PHOS: 58 U/L (ref 39–117)
ALT: 24 U/L (ref 0–35)
AST: 27 U/L (ref 0–37)
Albumin: 4 g/dL (ref 3.5–5.2)
BUN: 17 mg/dL (ref 6–23)
CO2: 26 mEq/L (ref 19–32)
CREATININE: 0.9 mg/dL (ref 0.40–1.20)
Calcium: 9.2 mg/dL (ref 8.4–10.5)
Chloride: 104 mEq/L (ref 96–112)
GFR: 67.77 mL/min (ref >60.00)
GLUCOSE: 96 mg/dL (ref 70–99)
POTASSIUM: 4 meq/L (ref 3.5–5.1)
SODIUM: 139 meq/L (ref 135–145)
TOTAL PROTEIN: 7.3 g/dL (ref 6.0–8.3)
Total Bilirubin: 0.3 mg/dL (ref 0.2–1.2)

## 2018-02-17 LAB — LDL CHOLESTEROL, DIRECT: Direct LDL: 66 mg/dL

## 2018-02-17 NOTE — Addendum Note (Signed)
Addended by: Warden FillersWRIGHT, LATOYA S on: 02/17/2018 08:51 AM   Modules accepted: Orders

## 2018-02-17 NOTE — Progress Notes (Signed)
Patient comes in today for a blood pressure check. Patient was seen for OV with PCP on 01/17/2018 and her blood pressure was 158/100 pulse was 63. Today patients blood pressure in left arm is 118/82 and pulse is 70. Patient denies any symptoms and states she feels great.

## 2018-02-18 LAB — HEPATITIS C ANTIBODY
HEP C AB: NONREACTIVE
SIGNAL TO CUT-OFF: 0.02 (ref <1.00)

## 2018-02-21 ENCOUNTER — Other Ambulatory Visit: Payer: Self-pay

## 2018-02-21 NOTE — Progress Notes (Signed)
Patient's blood pressure is well controlled.  She should continue with her current regimen.  She needs an office visit in 4 to 6 months for recheck.

## 2018-02-21 NOTE — Telephone Encounter (Signed)
Pt called no answer LM via voicemail to see if pt could come in at 3pm on Tuesday, February 22, 2018 instead of 4:15pm.

## 2018-02-22 ENCOUNTER — Telehealth: Payer: Self-pay

## 2018-02-22 ENCOUNTER — Encounter: Payer: 59 | Admitting: Obstetrics and Gynecology

## 2018-02-22 NOTE — Progress Notes (Signed)
Sent mychart message, patient is scheduled for follow up

## 2018-02-22 NOTE — Telephone Encounter (Signed)
-----   Message from Glori LuisEric G Sonnenberg, MD sent at 02/21/2018  5:16 PM EDT -----   ----- Message ----- From: Inetta FermoHendricks, Jessica S, CMA Sent: 02/17/2018   8:13 AM To: Glori LuisEric G Sonnenberg, MD

## 2018-02-24 ENCOUNTER — Other Ambulatory Visit: Payer: Self-pay | Admitting: Family Medicine

## 2018-02-24 ENCOUNTER — Ambulatory Visit
Admission: RE | Admit: 2018-02-24 | Discharge: 2018-02-24 | Disposition: A | Discharge status: Home / Self Care | Payer: 59 | Source: Ambulatory Visit | Attending: Family Medicine | Admitting: Family Medicine

## 2018-02-24 DIAGNOSIS — Z1239 Encounter for other screening for malignant neoplasm of breast: Secondary | ICD-10-CM

## 2018-02-24 DIAGNOSIS — Z1231 Encounter for screening mammogram for malignant neoplasm of breast: Secondary | ICD-10-CM | POA: Diagnosis not present

## 2018-04-21 ENCOUNTER — Other Ambulatory Visit: Payer: Self-pay | Admitting: Family Medicine

## 2018-04-21 NOTE — Telephone Encounter (Signed)
Last OV

## 2018-05-09 ENCOUNTER — Ambulatory Visit: Payer: 59 | Admitting: Family Medicine

## 2018-08-08 ENCOUNTER — Other Ambulatory Visit: Payer: Self-pay | Admitting: Family Medicine

## 2018-10-04 ENCOUNTER — Encounter: Payer: Self-pay | Admitting: Obstetrics and Gynecology

## 2018-10-04 ENCOUNTER — Ambulatory Visit (INDEPENDENT_AMBULATORY_CARE_PROVIDER_SITE_OTHER): Payer: BLUE CROSS/BLUE SHIELD | Admitting: Obstetrics and Gynecology

## 2018-10-04 VITALS — BP 128/77 | HR 64 | Ht 66.0 in | Wt 197.2 lb

## 2018-10-04 DIAGNOSIS — N950 Postmenopausal bleeding: Secondary | ICD-10-CM | POA: Diagnosis not present

## 2018-10-04 DIAGNOSIS — N952 Postmenopausal atrophic vaginitis: Secondary | ICD-10-CM | POA: Diagnosis not present

## 2018-10-04 DIAGNOSIS — Z809 Family history of malignant neoplasm, unspecified: Secondary | ICD-10-CM | POA: Diagnosis not present

## 2018-10-04 DIAGNOSIS — N930 Postcoital and contact bleeding: Secondary | ICD-10-CM

## 2018-10-04 NOTE — Progress Notes (Signed)
    GYNECOLOGY PROGRESS NOTE  Subjective:    Patient ID: RASHANA LIEBRECHT, female    DOB: [birth year], 63 y.o.   MRN: [id number]  HPI  Patient is a 63 y.o. [id number] female who presents for complaints of postcoital bleeding x 6 months.  Notes that it happens after most occurrences of sexual intercourse.  Bleeding is usually very light and self-limited.  Has been using lubricants during intercourse as she thought this would help.  Notes that she was becoming more concerned as she also has a family history of cancer (breast, ovarian, and cervical).    Of note, on review of chart, last physical exam performed by PCP in June 2019 noted possible cervical polyp. Last pap smear performed during that visit was negative.   The following portions of the patient's history were reviewed and updated as appropriate: allergies, current medications, past family history, past medical history, past social history, past surgical history and problem list.  Review of Systems Pertinent items noted in HPI and remainder of comprehensive ROS otherwise negative.   Objective:   Blood pressure 128/77, pulse 64, height 5\' 6"  (1.676 m), weight 197 lb 3.2 oz (89.4 kg). General appearance: alert and no distress Abdomen: soft, non-tender; bowel sounds normal; no masses,  no organomegaly Pelvic: external genitalia normal, rectovaginal septum normal.  Vagina without discharge, moderate vaginal atrophy.  Cervix normal appearing, no lesions and no motion tenderness.  Uterus mobile, nontender, normal shape and size.  Adnexae non-palpable, nontender bilaterally.     Assessment:   Postcoital bleeding Menopause Family h/o cancer  Plan:   - Patient with 6 months of PMB (postcoital). No evidence of cervical polyp noted on today's exam.  Discussed etiologies of postmenopausal bleeding, concern about precancerous/hyperplasia or cancerous etiology (5 to 10% percent of cases). Also discussed role of unopposed estrogen exposure in  leading to thickened or proliferative endometrium; and its possible correlation with endometrial hyperplasia/carcinoma.  However, she was reassured that endometrial/vaginal atrophy and endometrial polyps are the most common causes of postmenopausal bleeding.  Exclusion of cancer is the main objective; therefore, treatment is usually unnecessary once cancer has been excluded.  The primary goal in the diagnostic evaluation of postmenopausal women with uterine bleeding is to exclude malignancy; this can include endometrial biopsy and pelvic ultrasound.   Ultrasound ordered.  If no findings present, discussed treatment of atrophy with including vaginal estrogen, testosterone, or oral SERM therapy.  Given info on all options. Patient desires to avoid estrogen if possible due to family history.    A total of 15 minutes were spent face-to-face with the patient during this encounter and over half of that time dealt with counseling and coordination of care.

## 2018-10-04 NOTE — Progress Notes (Signed)
Pt is present today for follow up pap smear. Pt stated that after having sexual intercourse she notices vaginal bleeding.

## 2018-10-04 NOTE — Patient Instructions (Signed)
Postmenopausal Bleeding  Postmenopausal bleeding is any bleeding that happens after menopause. Menopause is when a woman's period stops. Any type of bleeding after menopause should be checked by your doctor. Treatment will depend on the cause. Follow these instructions at home:  Pay attention to any changes in your symptoms.  Avoid using tampons and douches as told by your doctor.  Change your pads regularly.  Get regular pelvic exams and Pap tests.  Take iron pills as told by your doctor.  Take over-the-counter and prescription medicines only as told by your doctor.  Keep all follow-up visits as told by your doctor. This is important. Contact a doctor if:  Your bleeding lasts for more than 1 week.  You have belly (abdominal) pain.  You have bleeding during or after sex (intercourse).  You have bleeding that happens more often than every 3 weeks. Get help right away if:  You have a fever, chills, a headache, dizziness, muscle aches, and bleeding.  You have strong pain with bleeding.  You have clumps of blood (blood clots) coming from your vagina.  You have a lot of bleeding and: ? You need more than 1 pad an hour. ? This has never happened before.  You feel like you are going to pass out (faint). Summary  Any type of bleeding after menopause should be checked by your doctor.  Pay attention to any changes in your symptoms.  Keep all follow-up visits as told by your doctor. This information is not intended to replace advice given to you by your health care provider. Make sure you discuss any questions you have with your health care provider. Document Released: 04/21/2008 Document Revised: 08/18/2016 Document Reviewed: 08/18/2016 Elsevier Interactive Patient Education  2019 [employer]   Atrophic Vaginitis  Atrophic vaginitis is a condition in which the tissues that line the vagina become dry and thin. This condition is most common in women who have stopped  having regular menstrual periods (are in menopause). This usually starts when a woman is 61 years old. That is the time when a woman's estrogen levels begin to drop (decrease). Estrogen is a female hormone. It helps to keep the tissues of the vagina moist. It stimulates the vagina to produce a clear fluid that lubricates the vagina for sexual intercourse. This fluid also protects the vagina from infection. Lack of estrogen can cause the lining of the vagina to get thinner and dryer. The vagina may also shrink in size. It may become less elastic. Atrophic vaginitis tends to get worse over time as a woman's estrogen level drops. What are the causes? This condition is caused by the normal drop in estrogen that happens around the time of menopause. What increases the risk? Certain conditions or situations may lower a woman's estrogen level, leading to a higher risk for atrophic vaginitis. You are more likely to develop this condition if:  You are taking medicines that block estrogen.  You have had your ovaries removed.  You are being treated for cancer with X-ray (radiation) or medicines (chemotherapy).  You have given birth or are breastfeeding.  You are older than age 35.  You smoke. What are the signs or symptoms? Symptoms of this condition include:  Pain, soreness, or bleeding during sexual intercourse (dyspareunia).  Vaginal burning, irritation, or itching.  Pain or bleeding when a speculum is used in a vaginal exam (pelvic exam).  Having burning pain when passing urine.  Vaginal discharge that is brown or yellow. In some cases, there  are no symptoms. How is this diagnosed? This condition is diagnosed by taking a medical history and doing a physical exam. This will include a pelvic exam that checks the vaginal tissues. Though rare, you may also have other tests, including:  A urine test.  A test that checks the acid balance in your vagina (acid balance test). How is this  treated? Treatment for this condition depends on how severe your symptoms are. Treatment may include:  Using an over-the-counter vaginal lubricant before sex.  Using a long-acting vaginal moisturizer.  Using low-dose vaginal estrogen for moderate to severe symptoms that do not respond to other treatments. Options include creams, tablets, and inserts (vaginal rings). Before you use a vaginal estrogen, tell your health care provider if you have a history of: ? Breast cancer. ? Endometrial cancer. ? Blood clots. If you are not sexually active and your symptoms are very mild, you may not need treatment. Follow these instructions at home: Medicines  Take over-the-counter and prescription medicines only as told by your health care provider. Do not use herbal or alternative medicines unless your health care provider says that you can.  Use over-the-counter creams, lubricants, or moisturizers for dryness only as directed by your health care provider. General instructions  If your atrophic vaginitis is caused by menopause, discuss all of your menopause symptoms and treatment options with your health care provider.  Do not douche.  Do not use products that can make your vagina dry. These include: ? Scented feminine sprays. ? Scented tampons. ? Scented soaps.  Vaginal intercourse can help to improve blood flow and elasticity of vaginal tissue. If it hurts to have sex, try using a lubricant or moisturizer just before having intercourse. Contact a health care provider if:  Your discharge looks different than normal.  Your vagina has an unusual smell.  You have new symptoms.  Your symptoms do not improve with treatment.  Your symptoms get worse. Summary  Atrophic vaginitis is a condition in which the tissues that line the vagina become dry and thin. It is most common in women who have stopped having regular menstrual periods (are in menopause).  Treatment options include using vaginal  lubricants and low-dose vaginal estrogen.  Contact a health care provider if your vagina has an unusual smell, or if your symptoms get worse or do not improve after treatment. This information is not intended to replace advice given to you by your health care provider. Make sure you discuss any questions you have with your health care provider. Document Released: 11/27/2014 Document Revised: 04/08/2017 Document Reviewed: 04/08/2017 Elsevier Interactive Patient Education  2019 [employer].

## 2018-10-05 ENCOUNTER — Encounter: Payer: Self-pay | Admitting: Obstetrics and Gynecology

## 2018-10-08 ENCOUNTER — Encounter (HOSPITAL_BASED_OUTPATIENT_CLINIC_OR_DEPARTMENT_OTHER): Payer: Self-pay | Admitting: Adult Health

## 2018-10-08 ENCOUNTER — Emergency Department (HOSPITAL_BASED_OUTPATIENT_CLINIC_OR_DEPARTMENT_OTHER): Payer: BLUE CROSS/BLUE SHIELD

## 2018-10-08 ENCOUNTER — Other Ambulatory Visit: Payer: Self-pay

## 2018-10-08 ENCOUNTER — Emergency Department (HOSPITAL_BASED_OUTPATIENT_CLINIC_OR_DEPARTMENT_OTHER)
Admission: EM | Admit: 2018-10-08 | Discharge: 2018-10-08 | Disposition: A | Discharge status: Home / Self Care | Payer: BLUE CROSS/BLUE SHIELD | Attending: Emergency Medicine | Admitting: Emergency Medicine

## 2018-10-08 DIAGNOSIS — B9789 Other viral agents as the cause of diseases classified elsewhere: Secondary | ICD-10-CM

## 2018-10-08 DIAGNOSIS — I10 Essential (primary) hypertension: Secondary | ICD-10-CM | POA: Insufficient documentation

## 2018-10-08 DIAGNOSIS — J069 Acute upper respiratory infection, unspecified: Secondary | ICD-10-CM | POA: Diagnosis not present

## 2018-10-08 DIAGNOSIS — Z79899 Other long term (current) drug therapy: Secondary | ICD-10-CM | POA: Insufficient documentation

## 2018-10-08 DIAGNOSIS — R05 Cough: Secondary | ICD-10-CM | POA: Diagnosis not present

## 2018-10-08 MED ORDER — SALINE SPRAY 0.65 % NA SOLN: 1.0000 | NASAL | 0 refills | Status: DC | PRN

## 2018-10-08 MED ORDER — HYDROCOD POLST-CPM POLST ER 10-8 MG/5ML PO SUER: 5.0000 mL | Freq: Every evening | ORAL | 0 refills | Status: DC | PRN

## 2018-10-08 MED ORDER — FLUTICASONE PROPIONATE 50 MCG/ACT NA SUSP: 1.0000 | Freq: Every day | NASAL | 0 refills | Status: DC

## 2018-10-08 NOTE — ED Triage Notes (Signed)
Presents with 4 days of cough, chills at night, sore throat and chest soreness with coughing. She denies bodyaches and fever and SOB. Endorses productive cough.

## 2018-10-08 NOTE — ED Provider Notes (Signed)
[facility] EMERGENCY DEPARTMENT Provider Note   CSN: [id number] Arrival date & time: 10/08/18  0947    History   Chief Complaint Chief Complaint  Patient presents with  . Cough    HPI Nichole Wilkins is a 63 y.o. female with history of hypertension, hypercholesterolemia who presents with a 4-day history of productive cough.  Patient reports she has had some intermittent chills, but no fever at home.  She reports some nasal congestion and sore throat that is worse at night.  She denies any chest pain, other than with coughing sometimes.  She denies any shortness of breath, domino pain, nausea, vomiting.  Patient has been taking her allergy medication at home without relief.  She reports she is around some people at work who had similar symptoms.  She denies any recent travel.     HPI  Past Medical History:  Diagnosis Date  . Allergic rhinitis   . Chickenpox   . Elevated blood pressure   . High cholesterol   . Hypertension   . Migraines     Patient Active Problem List   Diagnosis Date Noted  . Encounter for general adult medical examination with abnormal findings 01/17/2018  . Postmenopausal bleeding 01/17/2018  . Hyperlipidemia 10/04/2015  . Rash and nonspecific skin eruption 10/04/2015  . Hypertension 08/28/2015  . Chronic tension headaches 08/28/2015  . Family history of ovarian cancer 08/28/2015  . Polyp of skin 08/28/2015    Past Surgical History:  Procedure Laterality Date  . AUGMENTATION MAMMAPLASTY Bilateral 2000  . BREAST ENHANCEMENT SURGERY  2000  . TUBAL LIGATION  1994     OB History    Gravida  5   Para  5   Term  5   Preterm  0   AB  0   Living  5     SAB  0   TAB  0   Ectopic  0   Multiple  0   Live Births  5            Home Medications    Prior to Admission medications   Medication Sig Start Date End Date Taking? Authorizing Provider  atorvastatin (LIPITOR) 20 MG tablet Take 1 tablet (20 mg total) by mouth  daily. 01/17/18   Glori Luis, MD  chlorpheniramine-HYDROcodone [facility] PENNKINETIC ER) 10-8 MG/5ML SUER Take 5 mLs by mouth at bedtime as needed for cough. 10/08/18   Annya Lizana, Waylan Boga, PA-C  fluticasone (FLONASE) 50 MCG/ACT nasal spray Place 1 spray into both nostrils daily. 10/08/18   Giabella Duhart, Waylan Boga, PA-C  hydrochlorothiazide (MICROZIDE) 12.5 MG capsule TAKE 1 CAPSULE BY MOUTH DAILY 08/08/18   Glori Luis, MD  sodium chloride (OCEAN) 0.65 % SOLN nasal spray Place 1 spray into both nostrils as needed for congestion. 10/08/18   Emi Holes, PA-C    Family History Family History  Problem Relation Age of Onset  . Ovarian cancer Sister 26  . Cancer - Other Mother 15       Vulvar   . Cervical cancer Sister 63  . Cancer - Other Son 64       Testicular  . Breast cancer Maternal Grandmother   . Stroke Other        Parent  . Heart attack Father 7  . High blood pressure Other        Parent, other relative  . Cancer - Other Paternal Grandfather        unknown  Social History Social History   Tobacco Use  . Smoking status: Never Smoker  . Smokeless tobacco: Never Used  Substance Use Topics  . Alcohol use: No    Alcohol/week: 0.0 standard drinks  . Drug use: No     Allergies   Amlodipine   Review of Systems Review of Systems  Constitutional: Positive for chills. Negative for fever.  HENT: Positive for congestion and sore throat (at night). Negative for ear pain.   Respiratory: Positive for cough. Negative for shortness of breath.   Cardiovascular: Positive for chest pain (only with coughing).  Gastrointestinal: Negative for abdominal pain, diarrhea, nausea and vomiting.     Physical Exam Updated Vital Signs BP 132/82   Pulse 85   Temp 98.4 F (36.9 C) (Oral)   Resp 18   Ht 5\' 6"  (1.676 m)   Wt 89.4 kg   SpO2 95%   BMI 31.81 kg/m   Physical Exam Vitals signs and nursing note reviewed.  Constitutional:      General: She is not in acute  distress.    Appearance: She is well-developed. She is not diaphoretic.  HENT:     Head: Normocephalic and atraumatic.     Right Ear: Tympanic membrane normal.     Left Ear: Tympanic membrane normal.     Nose: Congestion present.     Mouth/Throat:     Pharynx: No oropharyngeal exudate.  Eyes:     General: No scleral icterus.       Right eye: No discharge.        Left eye: No discharge.     Conjunctiva/sclera: Conjunctivae normal.     Pupils: Pupils are equal, round, and reactive to light.  Neck:     Musculoskeletal: Normal range of motion and neck supple.     Thyroid: No thyromegaly.  Cardiovascular:     Rate and Rhythm: Normal rate and regular rhythm.     Heart sounds: Normal heart sounds. No murmur. No friction rub. No gallop.   Pulmonary:     Effort: Pulmonary effort is normal. No respiratory distress.     Breath sounds: Normal breath sounds. No stridor. No wheezing or rales.  Abdominal:     General: Bowel sounds are normal. There is no distension.     Palpations: Abdomen is soft.     Tenderness: There is no abdominal tenderness. There is no guarding or rebound.  Lymphadenopathy:     Cervical: No cervical adenopathy.  Skin:    General: Skin is warm and dry.     Coloration: Skin is not pale.     Findings: No rash.  Neurological:     Mental Status: She is alert.     Coordination: Coordination normal.      ED Treatments / Results  Labs (all labs ordered are listed, but only abnormal results are displayed) Labs Reviewed - No data to display  EKG None  Radiology Dg Chest 2 View  Result Date: 10/08/2018 CLINICAL DATA:  Cough and chills for 4 days. EXAM: CHEST - 2 VIEW COMPARISON:  None. FINDINGS: The cardiomediastinal silhouette is unremarkable. There is no evidence of focal airspace disease, pulmonary edema, suspicious pulmonary nodule/mass, pleural effusion, or pneumothorax. No acute bony abnormalities are identified. IMPRESSION: No active cardiopulmonary disease.  Electronically Signed   By: Harmon Pier M.D.   On: 10/08/2018 10:22    Procedures Procedures (including critical care time)  Medications Ordered in ED Medications - No data to display   Initial Impression /  Assessment and Plan / ED Course  I have reviewed the triage vital signs and the nursing notes.  Pertinent labs & imaging results that were available during my care of the patient were reviewed by me and considered in my medical decision making (see chart for details).        Patient with suspected viral URI.  Chest x-ray is clear.  Patient is in no acute distress with stable vitals.  Patient will be given Tussionex for cough, Flonase, and nasal saline.  Continue allergy medication at home.  Follow-up to PCP if symptoms not improving over the next week.  Patient counseled.  Patient understands and agrees with plan.  Given return precautions.  Patient vital stable throughout ED course and discharged in satisfactory condition.  Final Clinical Impressions(s) / ED Diagnoses   Final diagnoses:  Viral URI with cough    ED Discharge Orders         Ordered    fluticasone (FLONASE) 50 MCG/ACT nasal spray  Daily     10/08/18 1048    chlorpheniramine-HYDROcodone (TUSSIONEX PENNKINETIC ER) 10-8 MG/5ML SUER  At bedtime PRN     10/08/18 1048    sodium chloride (OCEAN) 0.65 % SOLN nasal spray  As needed     10/08/18 [address] [place], PA-C 10/08/18 1121    Cathren Laine, MD 10/08/18 1441

## 2018-10-08 NOTE — Discharge Instructions (Addendum)
Take Tussionex at night, as prescribed for cough.  Take Flonase once daily.  Continue taking your oral allergy medicine.  Use nasal saline throughout the day as needed for congestion.  Please follow-up with your doctor by the end of this week if your symptoms are not beginning to improve.  Please return to the emergency department or see your doctor immediately if you develop any persistent fever over 100.4, shortness of breath, passing out, or any other concerning symptoms.

## 2018-10-18 ENCOUNTER — Other Ambulatory Visit: Payer: BLUE CROSS/BLUE SHIELD

## 2018-10-31 ENCOUNTER — Telehealth: Payer: Self-pay

## 2018-10-31 NOTE — Telephone Encounter (Signed)
LMTRC needs to be prescreened. cm

## 2018-11-01 ENCOUNTER — Other Ambulatory Visit: Payer: Self-pay

## 2018-11-08 ENCOUNTER — Other Ambulatory Visit: Payer: Self-pay | Admitting: Family Medicine

## 2018-11-09 ENCOUNTER — Other Ambulatory Visit: Payer: Self-pay

## 2018-11-09 MED ORDER — HYDROCHLOROTHIAZIDE 12.5 MG PO CAPS: ORAL_CAPSULE | ORAL | 1 refills | Status: DC

## 2018-12-14 ENCOUNTER — Ambulatory Visit (INDEPENDENT_AMBULATORY_CARE_PROVIDER_SITE_OTHER): Payer: BLUE CROSS/BLUE SHIELD

## 2018-12-14 ENCOUNTER — Other Ambulatory Visit: Payer: Self-pay

## 2018-12-14 DIAGNOSIS — N950 Postmenopausal bleeding: Secondary | ICD-10-CM | POA: Diagnosis not present

## 2018-12-14 DIAGNOSIS — N930 Postcoital and contact bleeding: Secondary | ICD-10-CM

## 2019-02-08 ENCOUNTER — Other Ambulatory Visit: Payer: Self-pay | Admitting: Family Medicine

## 2019-03-10 ENCOUNTER — Other Ambulatory Visit: Payer: Self-pay | Admitting: Family Medicine

## 2019-03-13 NOTE — Telephone Encounter (Signed)
Given that she has not been seen in greater than 1 year the patient needs an office visit scheduled prior to this being refilled.  I will then refill to cover her until she is able to be seen in the office.

## 2019-04-25 ENCOUNTER — Other Ambulatory Visit: Payer: Self-pay

## 2019-04-26 ENCOUNTER — Encounter: Payer: Self-pay | Admitting: Family Medicine

## 2019-04-26 ENCOUNTER — Other Ambulatory Visit: Payer: Self-pay

## 2019-04-26 ENCOUNTER — Ambulatory Visit (INDEPENDENT_AMBULATORY_CARE_PROVIDER_SITE_OTHER): Payer: BC Managed Care – PPO | Admitting: Family Medicine

## 2019-04-26 VITALS — BP 120/80 | HR 66 | Temp 97.9°F | Ht 67.0 in | Wt 174.4 lb

## 2019-04-26 DIAGNOSIS — Z0001 Encounter for general adult medical examination with abnormal findings: Secondary | ICD-10-CM

## 2019-04-26 DIAGNOSIS — Z1329 Encounter for screening for other suspected endocrine disorder: Secondary | ICD-10-CM | POA: Diagnosis not present

## 2019-04-26 DIAGNOSIS — Z130 Encounter for screening for diseases of the blood and blood-forming organs and certain disorders involving the immune mechanism: Secondary | ICD-10-CM

## 2019-04-26 DIAGNOSIS — E785 Hyperlipidemia, unspecified: Secondary | ICD-10-CM

## 2019-04-26 DIAGNOSIS — I10 Essential (primary) hypertension: Secondary | ICD-10-CM | POA: Diagnosis not present

## 2019-04-26 DIAGNOSIS — Z23 Encounter for immunization: Secondary | ICD-10-CM | POA: Diagnosis not present

## 2019-04-26 DIAGNOSIS — Z1239 Encounter for other screening for malignant neoplasm of breast: Secondary | ICD-10-CM

## 2019-04-26 DIAGNOSIS — M79676 Pain in unspecified toe(s): Secondary | ICD-10-CM | POA: Insufficient documentation

## 2019-04-26 DIAGNOSIS — M79674 Pain in right toe(s): Secondary | ICD-10-CM

## 2019-04-26 DIAGNOSIS — R0989 Other specified symptoms and signs involving the circulatory and respiratory systems: Secondary | ICD-10-CM | POA: Insufficient documentation

## 2019-04-26 LAB — TSH: TSH: 0.97 u[IU]/mL (ref 0.35–4.50)

## 2019-04-26 LAB — LIPID PANEL
Cholesterol: 222 mg/dL — ABNORMAL HIGH (ref 0–200)
HDL: 61.6 mg/dL (ref >39.00)
LDL Cholesterol: 146 mg/dL — ABNORMAL HIGH (ref 0–99)
NonHDL: 160.81
Total CHOL/HDL Ratio: 4
Triglycerides: 76 mg/dL (ref 0.0–149.0)
VLDL: 15.2 mg/dL (ref 0.0–40.0)

## 2019-04-26 LAB — COMPREHENSIVE METABOLIC PANEL
ALT: 25 U/L (ref 0–35)
AST: 29 U/L (ref 0–37)
Albumin: 4.2 g/dL (ref 3.5–5.2)
Alkaline Phosphatase: 67 U/L (ref 39–117)
BUN: 12 mg/dL (ref 6–23)
CO2: 27 mEq/L (ref 19–32)
Calcium: 9.4 mg/dL (ref 8.4–10.5)
Chloride: 97 mEq/L (ref 96–112)
Creatinine, Ser: 0.72 mg/dL (ref 0.40–1.20)
GFR: 82.16 mL/min (ref >60.00)
Glucose, Bld: 88 mg/dL (ref 70–99)
Potassium: 3.7 mEq/L (ref 3.5–5.1)
Sodium: 131 mEq/L — ABNORMAL LOW (ref 135–145)
Total Bilirubin: 0.5 mg/dL (ref 0.2–1.2)
Total Protein: 7.3 g/dL (ref 6.0–8.3)

## 2019-04-26 LAB — CBC
HCT: 37.5 % (ref 36.0–46.0)
Hemoglobin: 12.7 g/dL (ref 12.0–15.0)
MCHC: 33.8 g/dL (ref 30.0–36.0)
MCV: 94.8 fl (ref 78.0–100.0)
Platelets: 243 10*3/uL (ref 150.0–400.0)
RBC: 3.96 Mil/uL (ref 3.87–5.11)
RDW: 13.5 % (ref 11.5–15.5)
WBC: 5.8 10*3/uL (ref 4.0–10.5)

## 2019-04-26 LAB — HEMOGLOBIN A1C: Hgb A1c MFr Bld: 5.8 % (ref 4.6–6.5)

## 2019-04-26 NOTE — Assessment & Plan Note (Signed)
Undetermined cause.  Discussed that this could represent a nerve impingement issue anywhere from her toes up to her back or an orthopedic issue.  Given that it is fairly limited we will have the patient continue to monitor.  If she develops any new or worsening symptoms she will let us know right away.

## 2019-04-26 NOTE — Assessment & Plan Note (Signed)
Physical exam completed.  She will continue diet and exercise.  Congratulated on weight loss.  Td given today.  She will get her flu shot through work.  She will consider Shingrix and check with her insurance.  Discussed following up with GYN if she continues to have postcoital bleeding to consider treatment as previously advised by them.  Lab work as outlined below.  Patient will call to schedule her mammogram.

## 2019-04-26 NOTE — Patient Instructions (Signed)
Nice to see you. Please continue with diet and exercise. Please consider the shingles vaccine and check with your insurance regarding whether or not they cover Shingrix. If you continue to have issues with vaginal bleeding please see the gynecologist for follow-up. Please call to schedule your mammogram.

## 2019-04-26 NOTE — Addendum Note (Signed)
Addended by: Fulton Mole D on: 04/26/2019 02:28 PM   Modules accepted: Orders

## 2019-04-26 NOTE — Assessment & Plan Note (Signed)
ABIs ordered. 

## 2019-04-26 NOTE — Progress Notes (Signed)
Nichole Rumps, MD Phone: [phone]  Nichole Wilkins is a 63 y.o. female who presents today for CPE.  Exercise: Walks 2 to 3 miles daily.  Also does [employer] 15 to 20 minutes daily. Diet: Lots of vegetables, chicken, and fish.  She cut soda way back.  No sweet tea.  She is down 23 pounds with diet and exercise. She gets her flu vaccine through work.  Due for tetanus vaccine.  She is going to check with her insurance regarding Shingrix. HIV screening up-to-date years ago.  Hepatitis C screening up-to-date. Pap smear 01/19/2018 was negative for cells and negative for HPV.  She did see gynecology for postcoital bleeding.  She had an ultrasound which was reassuring.  They recommended treatment though she has not decided on that. Mammogram is due. Colonoscopy up-to-date 12/13/2013 with no polyps.  10-year recall. No tobacco use, alcohol use, or illicit drug use. Sees a dentist and an ophthalmologist. She reports her right third through fifth toes ache at the tips occasionally.  No numbness or tingling.  No other symptoms with this.  This has been going on for about a year.  Active Ambulatory Problems    Diagnosis Date Noted  . Hypertension 08/28/2015  . Chronic tension headaches 08/28/2015  . Family history of ovarian cancer 08/28/2015  . Polyp of skin 08/28/2015  . Hyperlipidemia 10/04/2015  . Rash and nonspecific skin eruption 10/04/2015  . Encounter for general adult medical examination with abnormal findings 01/17/2018  . Postmenopausal bleeding 01/17/2018  . Decreased pedal pulses 04/26/2019  . Toe pain 04/26/2019   Resolved Ambulatory Problems    Diagnosis Date Noted  . No Resolved Ambulatory Problems   Past Medical History:  Diagnosis Date  . Allergic rhinitis   . Chickenpox   . Elevated blood pressure   . High cholesterol   . Migraines     Family History  Problem Relation Age of Onset  . Ovarian cancer Sister 74  . Cancer - Other Mother 27       Vulvar   .  Cervical cancer Sister 55  . Cancer - Other Son 66       Testicular  . Breast cancer Maternal Grandmother   . Stroke Other        Parent  . Heart attack Father 88  . High blood pressure Other        Parent, other relative  . Cancer - Other Paternal Grandfather        unknown    Social History   Socioeconomic History  . Marital status: Married    Spouse name: Not on file  . Number of children: Not on file  . Years of education: Not on file  . Highest education level: Not on file  Occupational History  . Not on file  Social Needs  . Financial resource strain: Not on file  . Food insecurity    Worry: Not on file    Inability: Not on file  . Transportation needs    Medical: Not on file    Non-medical: Not on file  Tobacco Use  . Smoking status: Never Smoker  . Smokeless tobacco: Never Used  Substance and Sexual Activity  . Alcohol use: No    Alcohol/week: 0.0 standard drinks  . Drug use: No  . Sexual activity: Yes    Birth control/protection: Post-menopausal  Lifestyle  . Physical activity    Days per week: Not on file    Minutes per session: Not on  file  . Stress: Not on file  Relationships  . Social Herbalist on phone: Not on file    Gets together: Not on file    Attends religious service: Not on file    Active member of club or organization: Not on file    Attends meetings of clubs or organizations: Not on file    Relationship status: Not on file  . Intimate partner violence    Fear of current or ex partner: Not on file    Emotionally abused: Not on file    Physically abused: Not on file    Forced sexual activity: Not on file  Other Topics Concern  . Not on file  Social History Narrative  . Not on file    ROS  General:  Negative for nexplained weight loss, fever Skin: Negative for new or changing mole, sore that won't heal HEENT: Negative for trouble hearing, trouble seeing, ringing in ears, mouth sores, hoarseness, change in voice,  dysphagia. CV:  Negative for chest pain, dyspnea, edema, palpitations Resp: Negative for cough, dyspnea, hemoptysis GI: Negative for nausea, vomiting, diarrhea, constipation, abdominal pain, melena, hematochezia. GU: Negative for dysuria, incontinence, urinary hesitance, hematuria, vaginal or penile discharge, polyuria, sexual difficulty, lumps in testicle or breasts MSK: Positive for muscle cramps or aches, negative for joint pain or swelling Neuro: Negative for headaches, weakness, numbness, dizziness, passing out/fainting Psych: Negative for depression, anxiety, memory problems  Objective  Physical Exam Vitals:   04/26/19 1318  BP: 120/80  Pulse: 66  Temp: 97.9 F (36.6 C)  SpO2: 99%    BP Readings from Last 3 Encounters:  04/26/19 120/80  10/08/18 132/82  10/04/18 128/77   Wt Readings from Last 3 Encounters:  04/26/19 174 lb 6.4 oz (79.1 kg)  10/08/18 197 lb 1.5 oz (89.4 kg)  10/04/18 197 lb 3.2 oz (89.4 kg)    Physical Exam Constitutional:      General: She is not in acute distress.    Appearance: She is not diaphoretic.  HENT:     Head: Normocephalic and atraumatic.  Eyes:     Conjunctiva/sclera: Conjunctivae normal.     Pupils: Pupils are equal, round, and reactive to light.  Cardiovascular:     Rate and Rhythm: Normal rate and regular rhythm.     Heart sounds: Normal heart sounds.     Comments: 2+ left DP and PT pulse, 2+ right PT pulse, difficult to palpate right DP pulse Pulmonary:     Effort: Pulmonary effort is normal.     Breath sounds: Normal breath sounds.  Abdominal:     General: Bowel sounds are normal. There is no distension.     Palpations: Abdomen is soft.     Tenderness: There is no abdominal tenderness. There is no guarding or rebound.  Genitourinary:    Comments: Chaperone used, bilateral breast with no skin changes, nipple inversion, masses, or tenderness, no axillary masses bilaterally Musculoskeletal:     Comments: Bilateral toes with  no tenderness, bony defects, or erythema, toes are warm  Skin:    General: Skin is warm and dry.  Neurological:     Mental Status: She is alert.  Psychiatric:        Mood and Affect: Mood normal.      Assessment/Plan:   Encounter for general adult medical examination with abnormal findings Physical exam completed.  She will continue diet and exercise.  Congratulated on weight loss.  Td given today.  She will  get her flu shot through work.  She will consider Shingrix and check with her insurance.  Discussed following up with GYN if she continues to have postcoital bleeding to consider treatment as previously advised by them.  Lab work as outlined below.  Patient will call to schedule her mammogram.  Decreased pedal pulses ABIs ordered.  Toe pain Undetermined cause.  Discussed that this could represent a nerve impingement issue anywhere from her toes up to her back or an orthopedic issue.  Given that it is fairly limited we will have the patient continue to monitor.  If she develops any new or worsening symptoms she will let us know right away.   Orders Placed This Encounter  Procedures  . MM 3D SCREEN BREAST BILATERAL    Standing Status:   Future    Standing Expiration Date:   06/25/2020    Order Specific Question:   Reason for Exam (SYMPTOM  OR DIAGNOSIS REQUIRED)    Answer:   breast cancer screening    Order Specific Question:   Preferred imaging location?    Answer:   [facility]  . US ARTERIAL ABI (SCREENING LOWER EXTREMITY)    Standing Status:   Future    Standing Expiration Date:   06/25/2020    Order Specific Question:   Reason for Exam (SYMPTOM  OR DIAGNOSIS REQUIRED)    Answer:   Difficult to palpate right DP pulse    Order Specific Question:   Preferred imaging location?    Answer:   [facility]  . Comp Met (CMET)  . Lipid panel  . HgB A1c  . TSH  . CBC    No orders of the defined types were placed in this encounter.    Nichole Rumps, MD  [place]

## 2019-04-27 ENCOUNTER — Other Ambulatory Visit: Payer: Self-pay | Admitting: Family Medicine

## 2019-04-27 DIAGNOSIS — E871 Hypo-osmolality and hyponatremia: Secondary | ICD-10-CM

## 2019-05-05 ENCOUNTER — Other Ambulatory Visit (INDEPENDENT_AMBULATORY_CARE_PROVIDER_SITE_OTHER): Payer: BC Managed Care – PPO

## 2019-05-05 ENCOUNTER — Other Ambulatory Visit: Payer: Self-pay

## 2019-05-05 DIAGNOSIS — E871 Hypo-osmolality and hyponatremia: Secondary | ICD-10-CM

## 2019-05-05 LAB — BASIC METABOLIC PANEL
BUN: 15 mg/dL (ref 6–23)
CO2: 27 mEq/L (ref 19–32)
Calcium: 9.7 mg/dL (ref 8.4–10.5)
Chloride: 98 mEq/L (ref 96–112)
Creatinine, Ser: 0.82 mg/dL (ref 0.40–1.20)
GFR: 70.71 mL/min (ref >60.00)
Glucose, Bld: 82 mg/dL (ref 70–99)
Potassium: 4.1 mEq/L (ref 3.5–5.1)
Sodium: 133 mEq/L — ABNORMAL LOW (ref 135–145)

## 2019-06-10 ENCOUNTER — Other Ambulatory Visit: Payer: Self-pay | Admitting: Family Medicine

## 2019-07-24 ENCOUNTER — Encounter: Payer: Self-pay | Admitting: Family Medicine

## 2019-07-24 DIAGNOSIS — T8543XA Leakage of breast prosthesis and implant, initial encounter: Secondary | ICD-10-CM

## 2019-08-02 ENCOUNTER — Other Ambulatory Visit: Payer: Self-pay | Admitting: Family Medicine

## 2019-08-02 DIAGNOSIS — T8543XA Leakage of breast prosthesis and implant, initial encounter: Secondary | ICD-10-CM

## 2019-08-09 ENCOUNTER — Other Ambulatory Visit: Payer: Self-pay

## 2019-08-09 ENCOUNTER — Ambulatory Visit
Admission: RE | Admit: 2019-08-09 | Discharge: 2019-08-09 | Disposition: A | Discharge status: Home / Self Care | Payer: 59 | Source: Ambulatory Visit | Attending: Family Medicine | Admitting: Family Medicine

## 2019-08-09 ENCOUNTER — Other Ambulatory Visit: Payer: Self-pay | Admitting: Family Medicine

## 2019-08-09 ENCOUNTER — Ambulatory Visit
Admission: RE | Admit: 2019-08-09 | Discharge: 2019-08-09 | Disposition: A | Discharge status: Home / Self Care | Payer: BC Managed Care – PPO | Source: Ambulatory Visit | Attending: Family Medicine | Admitting: Family Medicine

## 2019-08-09 DIAGNOSIS — T8543XA Leakage of breast prosthesis and implant, initial encounter: Secondary | ICD-10-CM

## 2019-09-22 ENCOUNTER — Other Ambulatory Visit: Payer: Self-pay | Admitting: Family Medicine

## 2019-09-22 ENCOUNTER — Ambulatory Visit
Admission: RE | Admit: 2019-09-22 | Discharge: 2019-09-22 | Disposition: A | Discharge status: Home / Self Care | Payer: 59 | Source: Ambulatory Visit | Attending: Family Medicine | Admitting: Family Medicine

## 2019-09-22 ENCOUNTER — Other Ambulatory Visit: Payer: Self-pay

## 2019-09-22 DIAGNOSIS — T8543XA Leakage of breast prosthesis and implant, initial encounter: Secondary | ICD-10-CM

## 2019-09-25 ENCOUNTER — Encounter: Payer: Self-pay | Admitting: Family Medicine

## 2019-12-08 ENCOUNTER — Other Ambulatory Visit: Payer: Self-pay | Admitting: Family Medicine

## 2020-03-07 ENCOUNTER — Encounter: Payer: Self-pay | Admitting: Family Medicine

## 2020-03-07 DIAGNOSIS — T8543XD Leakage of breast prosthesis and implant, subsequent encounter: Secondary | ICD-10-CM

## 2020-03-22 ENCOUNTER — Other Ambulatory Visit: Payer: 59

## 2020-04-10 ENCOUNTER — Other Ambulatory Visit: Payer: Self-pay

## 2020-04-10 ENCOUNTER — Ambulatory Visit (INDEPENDENT_AMBULATORY_CARE_PROVIDER_SITE_OTHER): Payer: 59 | Admitting: Plastic Surgery

## 2020-04-10 DIAGNOSIS — T8543XA Leakage of breast prosthesis and implant, initial encounter: Secondary | ICD-10-CM | POA: Diagnosis not present

## 2020-04-10 NOTE — Progress Notes (Signed)
Referring Provider Glori Luis, MD [address] [place],  [place] [address]   CC:  Chief Complaint  Patient presents with  . Consult      Nichole Wilkins is an 64 y.o. female.  HPI: Patient presents with concerns of a deflated left saline implant.  She had implants placed in [place] about 20 years ago for cosmetic reasons.  They were saline implants in place beneath the muscle.  In March she noticed that the left sided deflated and this was confirmed with ultrasound.  She is interested in having her current implants removed and would also like a lift.  She believes that she would like to have a additional smaller implant replaced but she is unsure about this.  She is up-to-date on her mammograms.  She does not smoke.  Allergies  Allergen Reactions  . Amlodipine Rash    Outpatient Encounter Medications as of 04/10/2020  Medication Sig  . hydrochlorothiazide (MICROZIDE) 12.5 MG capsule TAKE 1 CAPSULE BY MOUTH EVERY DAY   No facility-administered encounter medications on file as of 04/10/2020.     Past Medical History:  Diagnosis Date  . Allergic rhinitis   . Chickenpox   . Elevated blood pressure   . High cholesterol   . Hypertension   . Migraines     Past Surgical History:  Procedure Laterality Date  . AUGMENTATION MAMMAPLASTY Bilateral 2000  . BREAST ENHANCEMENT SURGERY  2000  . TUBAL LIGATION  1994    Family History  Problem Relation Age of Onset  . Ovarian cancer Sister 6  . Cancer - Other Mother 48       Vulvar   . Cervical cancer Sister 76  . Cancer - Other Son 20       Testicular  . Breast cancer Maternal Grandmother   . Stroke Other        Parent  . Heart attack Father 37  . High blood pressure Other        Parent, other relative  . Cancer - Other Paternal Grandfather        unknown    Social History   Social History Narrative  . Not on file     Review of Systems General: Denies fevers, chills, weight loss CV: Denies  chest pain, shortness of breath, palpitations  Physical Exam Vitals with BMI 04/26/2019 10/08/2018 10/04/2018  Height 5\' 7"  5\' 6"  5\' 6"   Weight 174 lbs 6 oz 197 lbs 1 oz 197 lbs 3 oz  BMI 27.31 31.83 31.84  Systolic 120 132  Diastolic 80 82 77  Pulse 66 85 64    General:  No acute distress,  Alert and oriented, Non-Toxic, Normal speech and affect Examination of the breast shows bilateral periareolar scars.  The capsules are soft on both sides and the left side appears completely deflated.  I do not see any other additional scars or masses.  Her base width is about 12 cm  Assessment/Plan Patient presents with a deflated left saline implant.  We discussed removal of her current implants and mastopexy plus or minus the replacement of new implants.  After trying the different sizers we had in the office she feels like she has enough breast tissue left behind on the left side and does not feel like she needs a implant placed at the time of surgery.  I think this is a reasonable and appropriate plan.  We plan to do bilateral removal of implants and bilateral mastopexy.  I explained the scars from the mastopexy would need to be an anchor shape and go around the nipple and along the inframammary crease connected by a vertical limb.  I explained the expected recovery time and the risks of the procedure include bleeding, infection, damage surrounding structures and need for additional procedures.  We did discuss the fact that prior surgery around the nipple areolar complex does put it at a little higher risk of having ischemic or sensory changes but I think the chances of this would be small given the planned operation.  We will plan to provide a quote for her and get this scheduled.  Nichole Wilkins 04/10/2020, 3:24 PM

## 2020-06-04 ENCOUNTER — Other Ambulatory Visit: Payer: Self-pay | Admitting: Family Medicine

## 2020-09-01 IMAGING — MG MM DIGITAL DIAGNOSTIC UNILAT*L* W/ TOMO W/ CAD
6 series · 6 of 18 positions shown · non-contrast
Comparison: Previous exam(s).

CLINICAL DATA: Short-term interval follow-up of probable changes to
the left breast from implant rupture.

EXAM:
DIGITAL DIAGNOSTIC LEFT MAMMOGRAM WITH CAD AND TOMO
ULTRASOUND LEFT BREAST

[L MLO synth-2D (1 of 2)]
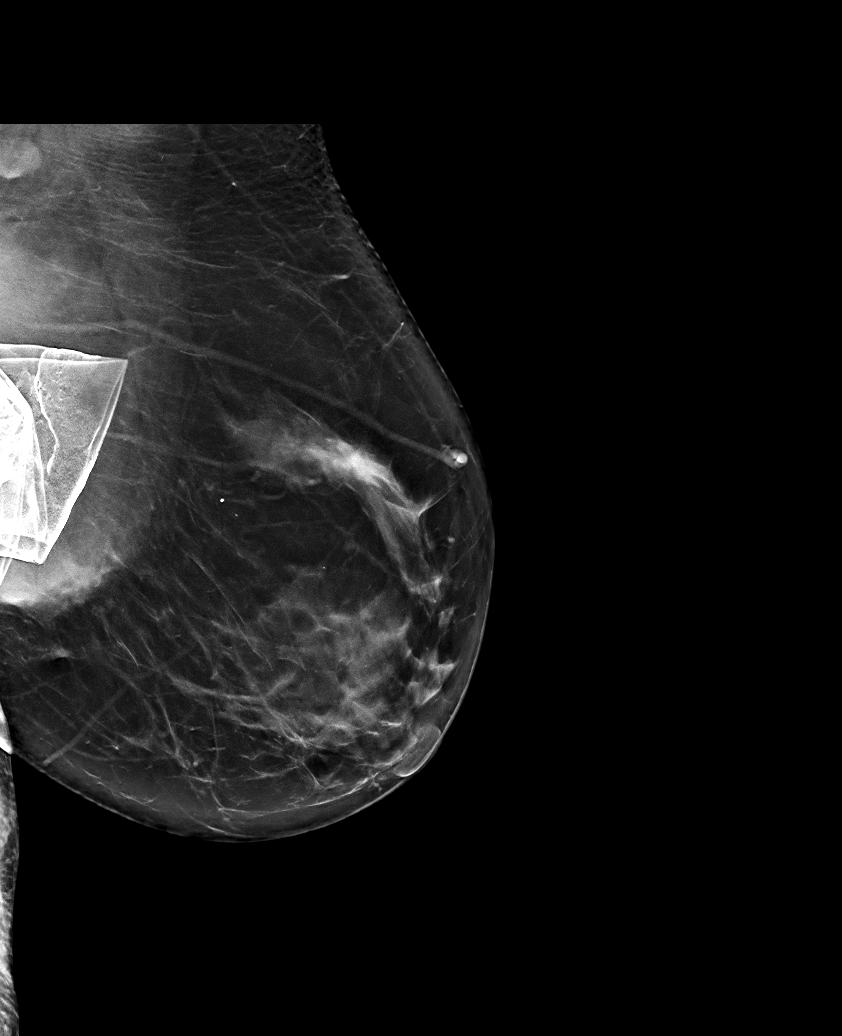

[L CC synth-2D]
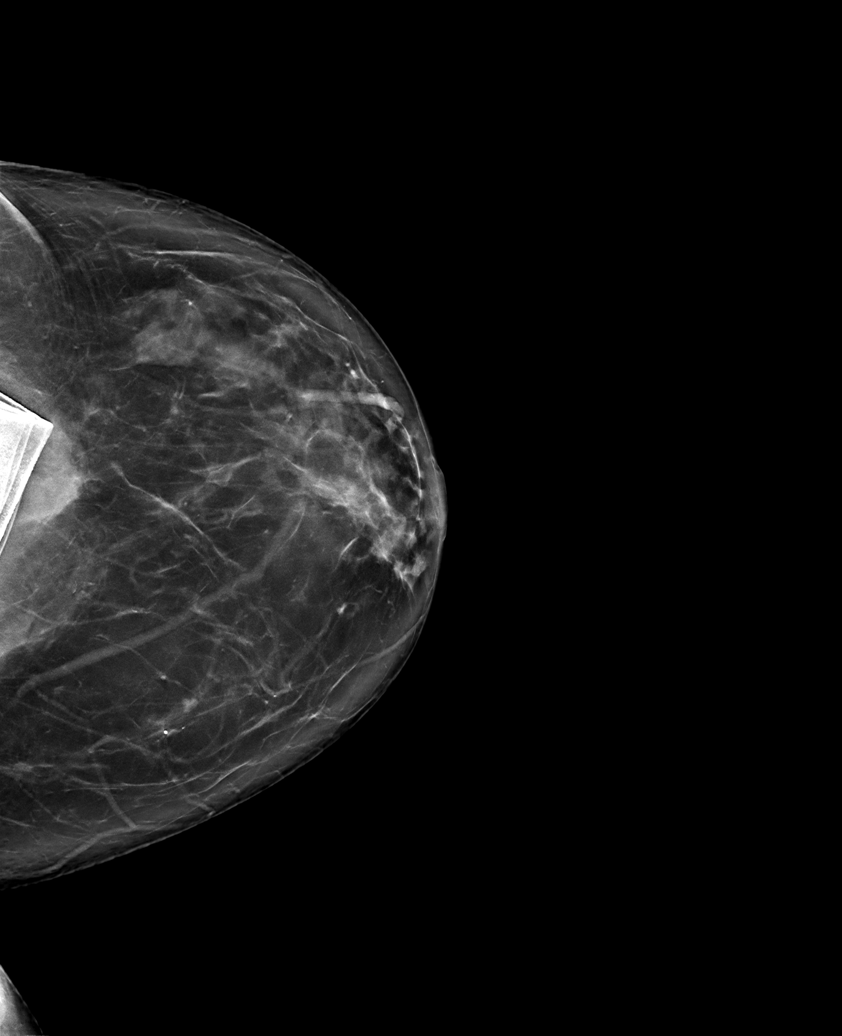

[L MLO synth-2D (2 of 2)]
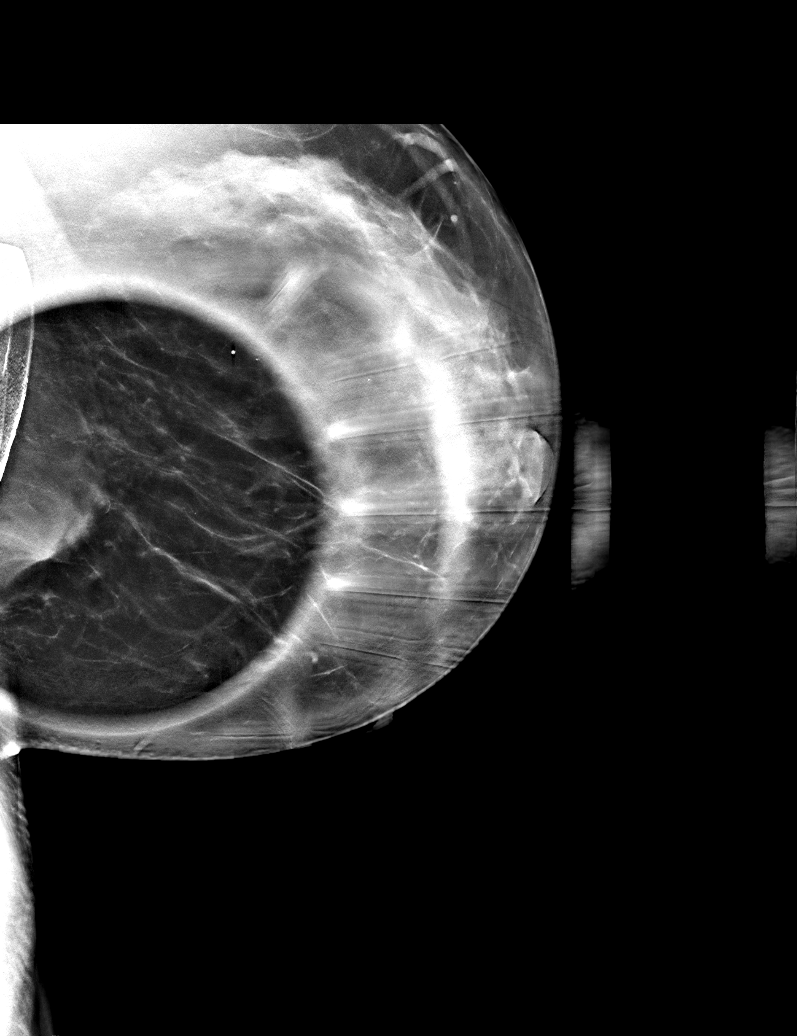

[L MLO tomo (1 of 2) · tomo slice 45/90.0]
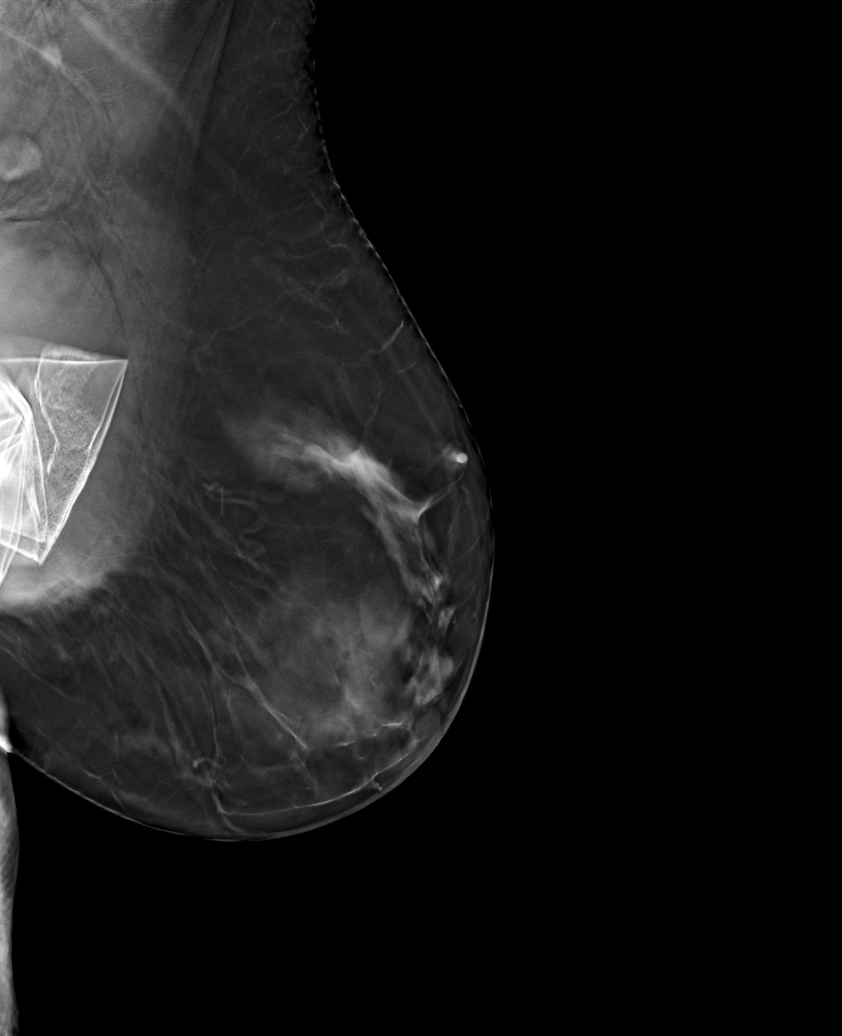

[L CC tomo · tomo slice 45/90.0]
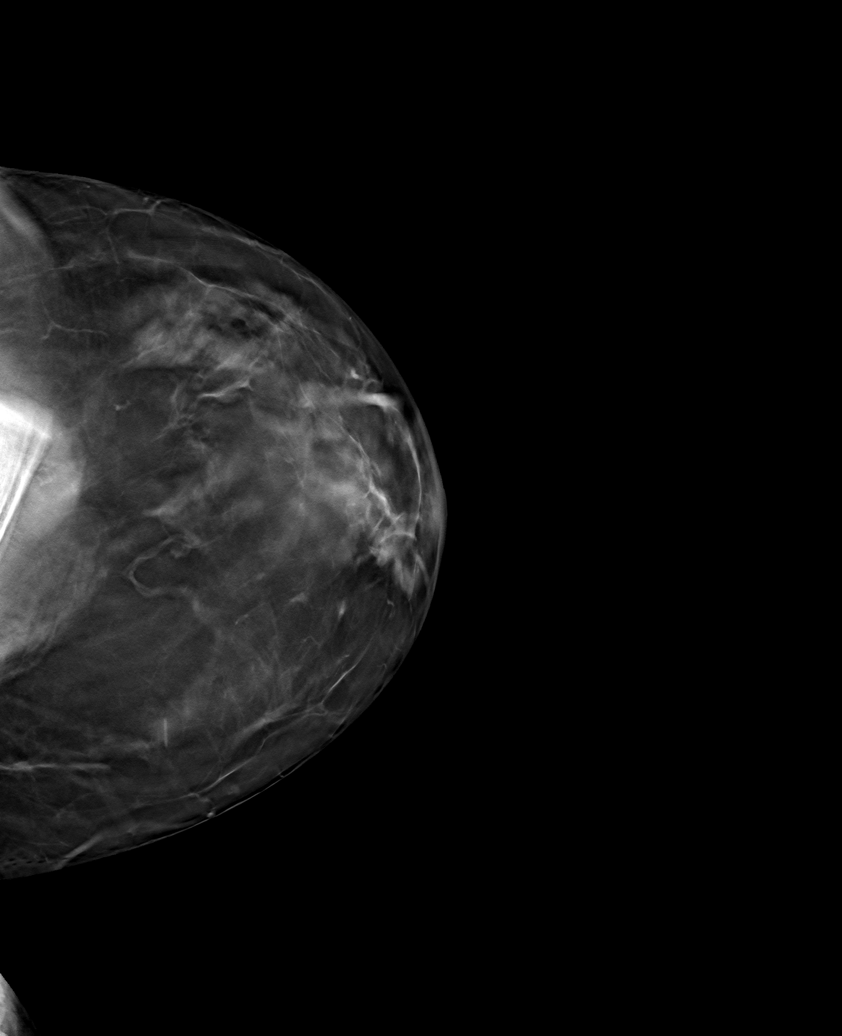

[L MLO tomo (2 of 2) · tomo slice 37/74.0]
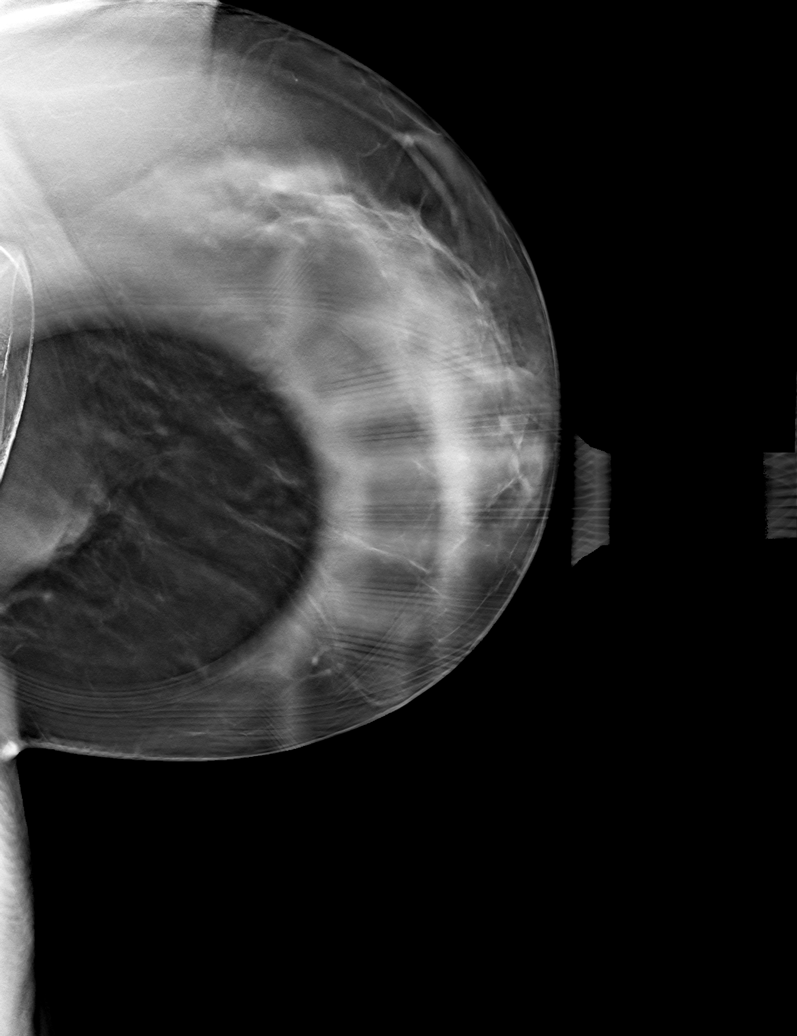

[6 of 18 positions shown; findings below may reference images not displayed]

ACR Breast Density Category c: The breast tissue is heterogeneously
dense, which may obscure small masses.
FINDINGS: The left breast implant is deflated. The asymmetry in the inferior
aspect of the left breast on the lateral views is stable. No
suspicious mass or malignant type microcalcifications identified.

Mammographic images were processed with CAD.

Targeted ultrasound is performed, showing there is a fluid
collection in the left breast at 6 o'clock 5 cm from the nipple.
IMPRESSION: Changes in the left breast are thought to be secondary to implant
rupture.

RECOMMENDATION:
Short-term interval follow-up left mammogram and ultrasound 6 months
is recommended.

I have discussed the findings and recommendations with the patient.
If applicable, a reminder letter will be sent to the patient
regarding the next appointment.

BI-RADS CATEGORY  3: Probably benign.

## 2020-11-29 ENCOUNTER — Other Ambulatory Visit: Payer: Self-pay | Admitting: Family Medicine

## 2021-06-07 ENCOUNTER — Other Ambulatory Visit: Payer: Self-pay | Admitting: Family Medicine

## 2021-09-03 ENCOUNTER — Encounter: Payer: Self-pay | Admitting: Family Medicine

## 2021-09-04 NOTE — Telephone Encounter (Signed)
This patient has not been seen since 2020. She needs a visit to discuss this.

## 2021-12-07 ENCOUNTER — Other Ambulatory Visit: Payer: Self-pay | Admitting: Family Medicine

## 2021-12-08 ENCOUNTER — Other Ambulatory Visit: Payer: Self-pay | Admitting: Family Medicine

## 2021-12-08 NOTE — Telephone Encounter (Signed)
I called and spoke with patient & she has appointment scheduled Friday. She has not been seen since 04/26/2019. ?

## 2021-12-12 ENCOUNTER — Ambulatory Visit (INDEPENDENT_AMBULATORY_CARE_PROVIDER_SITE_OTHER): Payer: 59 | Admitting: Family Medicine

## 2021-12-12 ENCOUNTER — Encounter: Payer: Self-pay | Admitting: Family Medicine

## 2021-12-12 VITALS — BP 120/79 | HR 67 | Temp 97.7°F | Ht 67.0 in | Wt 174.2 lb

## 2021-12-12 DIAGNOSIS — R7303 Prediabetes: Secondary | ICD-10-CM | POA: Diagnosis not present

## 2021-12-12 DIAGNOSIS — I10 Essential (primary) hypertension: Secondary | ICD-10-CM | POA: Diagnosis not present

## 2021-12-12 DIAGNOSIS — Z1231 Encounter for screening mammogram for malignant neoplasm of breast: Secondary | ICD-10-CM | POA: Diagnosis not present

## 2021-12-12 DIAGNOSIS — E785 Hyperlipidemia, unspecified: Secondary | ICD-10-CM

## 2021-12-12 MED ORDER — HYDROCHLOROTHIAZIDE 12.5 MG PO CAPS: 12.5000 mg | ORAL_CAPSULE | Freq: Every day | ORAL | 3 refills | Status: DC

## 2021-12-12 NOTE — Assessment & Plan Note (Signed)
Continue healthy diet and exercise.  Check A1c.

## 2021-12-12 NOTE — Assessment & Plan Note (Signed)
Adequately controlled.  She will continue HCTZ 12.5 mg daily.  We will check lab work.

## 2021-12-12 NOTE — Assessment & Plan Note (Signed)
Check lipid panel  

## 2021-12-12 NOTE — Patient Instructions (Signed)
Nice to see you. Please continue with healthy diet and exercise. We will contact you with your lab results. Please call [phone] to schedule your mammogram.

## 2021-12-12 NOTE — Progress Notes (Signed)
  Marikay Alar, MD Phone: [phone]  Nichole Wilkins is a 66 y.o. female who presents today for f/u.  HYPERTENSION Disease Monitoring Home BP Monitoring not checking Chest pain- no    Dyspnea- no Medications Compliance-  taking HCTZ.   Edema- no BMET    Component Value Date/Time   NA 133 (L) 05/05/2019 1407   K 4.1 05/05/2019 1407   CL 98 05/05/2019 1407   CO2 27 05/05/2019 1407   GLUCOSE 82 05/05/2019 1407   BUN 15 05/05/2019 1407   CREATININE 0.82 05/05/2019 1407   CALCIUM 9.7 05/05/2019 1407   Prediabetes: no polyuria or polydipsia. Has reduced carb intake and fried food intake. Has been focused on proteins, vegetables, and fruits. Walks 3-5 miles per day. Has lost 20 lbs since the start of the year.   Social History   Tobacco Use  Smoking Status Never  Smokeless Tobacco Never    No current outpatient medications on file prior to visit.   No current facility-administered medications on file prior to visit.     ROS see history of present illness  Objective  Physical Exam Vitals:   12/12/21 1424  BP: 120/79  Pulse: 67  Temp: 97.7 F (36.5 C)  SpO2: 99%    BP Readings from Last 3 Encounters:  12/12/21 120/79  04/26/19 120/80  10/08/18 132/82   Wt Readings from Last 3 Encounters:  12/12/21 174 lb 3.2 oz (79 kg)  04/26/19 174 lb 6.4 oz (79.1 kg)  10/08/18 197 lb 1.5 oz (89.4 kg)    Physical Exam Constitutional:      General: She is not in acute distress.    Appearance: She is not diaphoretic.  Cardiovascular:     Rate and Rhythm: Normal rate and regular rhythm.     Heart sounds: Normal heart sounds.  Pulmonary:     Effort: Pulmonary effort is normal.     Breath sounds: Normal breath sounds.  Musculoskeletal:     Right lower leg: No edema.     Left lower leg: No edema.  Skin:    General: Skin is warm and dry.  Neurological:     Mental Status: She is alert.     Assessment/Plan: Please see individual problem list.  Problem List  Items Addressed This Visit     Hyperlipidemia (Chronic)    Check lipid panel.       Relevant Medications   hydrochlorothiazide (MICROZIDE) 12.5 MG capsule   Other Relevant Orders   Lipid panel   BASIC METABOLIC PANEL WITH GFR   Hepatic function panel   Hypertension - Primary (Chronic)    Adequately controlled.  She will continue HCTZ 12.5 mg daily.  We will check lab work.       Relevant Medications   hydrochlorothiazide (MICROZIDE) 12.5 MG capsule   Other Relevant Orders   BASIC METABOLIC PANEL WITH GFR   Prediabetes (Chronic)    Continue healthy diet and exercise.  Check A1c.       Relevant Orders   HgB A1c   BASIC METABOLIC PANEL WITH GFR   Other Visit Diagnoses     Encounter for screening mammogram for malignant neoplasm of breast       Relevant Orders   MM 3D SCREEN BREAST BILATERAL        Health Maintenance: she will call to schedule her mammogram.   Return in about 6 months (around 06/14/2022) for HTN.   Marikay Alar, MD [facility]

## 2021-12-13 LAB — HEPATIC FUNCTION PANEL
AG Ratio: 1.6 (calc) (ref 1.0–2.5)
ALT: 21 U/L (ref 6–29)
AST: 25 U/L (ref 10–35)
Albumin: 4.4 g/dL (ref 3.6–5.1)
Alkaline phosphatase (APISO): 65 U/L (ref 37–153)
Bilirubin, Direct: 0.1 mg/dL (ref 0.0–0.2)
Globulin: 2.7 g/dL (calc) (ref 1.9–3.7)
Indirect Bilirubin: 0.3 mg/dL (calc) (ref 0.2–1.2)
Total Bilirubin: 0.4 mg/dL (ref 0.2–1.2)
Total Protein: 7.1 g/dL (ref 6.1–8.1)

## 2021-12-13 LAB — LIPID PANEL
Cholesterol: 199 mg/dL (ref <200)
HDL: 53 mg/dL (ref >=50)
LDL Cholesterol (Calc): 132 mg/dL (calc) — ABNORMAL HIGH
Non-HDL Cholesterol (Calc): 146 mg/dL (calc) — ABNORMAL HIGH (ref <130)
Total CHOL/HDL Ratio: 3.8 (calc) (ref <5.0)
Triglycerides: 52 mg/dL (ref <150)

## 2021-12-13 LAB — BASIC METABOLIC PANEL WITH GFR
BUN: 12 mg/dL (ref 7–25)
CO2: 24 mmol/L (ref 20–32)
Calcium: 9.5 mg/dL (ref 8.6–10.4)
Chloride: 105 mmol/L (ref 98–110)
Creat: 0.7 mg/dL (ref 0.50–1.05)
Glucose, Bld: 82 mg/dL (ref 65–99)
Potassium: 4.3 mmol/L (ref 3.5–5.3)
Sodium: 140 mmol/L (ref 135–146)
eGFR: 97 mL/min/{1.73_m2} (ref >=60)

## 2021-12-13 LAB — HEMOGLOBIN A1C
Hgb A1c MFr Bld: 5.4 % of total Hgb (ref <5.7)
Mean Plasma Glucose: 108 mg/dL
eAG (mmol/L): 6 mmol/L

## 2021-12-25 ENCOUNTER — Telehealth: Payer: 59 | Admitting: Physician Assistant

## 2021-12-25 DIAGNOSIS — J029 Acute pharyngitis, unspecified: Secondary | ICD-10-CM | POA: Diagnosis not present

## 2021-12-25 MED ORDER — AMOXICILLIN 500 MG PO CAPS: 500.0000 mg | ORAL_CAPSULE | Freq: Two times a day (BID) | ORAL | 0 refills | Status: AC

## 2021-12-25 MED ORDER — LIDOCAINE VISCOUS HCL 2 % MT SOLN: 15.0000 mL | Freq: Four times a day (QID) | OROMUCOSAL | 0 refills | Status: DC | PRN

## 2021-12-25 MED ORDER — PREDNISONE 20 MG PO TABS: 40.0000 mg | ORAL_TABLET | Freq: Every day | ORAL | 0 refills | Status: DC

## 2021-12-25 NOTE — Progress Notes (Signed)
Virtual Visit Consent   Nichole Wilkins, you are scheduled for a virtual visit with a [facility] provider today. Just as with appointments in the office, your consent must be obtained to participate. Your consent will be active for this visit and any virtual visit you may have with one of our providers in the next 365 days. If you have a MyChart account, a copy of this consent can be sent to you electronically.  As this is a virtual visit, video technology does not allow for your provider to perform a traditional examination. This may limit your provider's ability to fully assess your condition. If your provider identifies any concerns that need to be evaluated in person or the need to arrange testing (such as labs, EKG, etc.), we will make arrangements to do so. Although advances in technology are sophisticated, we cannot ensure that it will always work on either your end or our end. If the connection with a video visit is poor, the visit may have to be switched to a telephone visit. With either a video or telephone visit, we are not always able to ensure that we have a secure connection.  By engaging in this virtual visit, you consent to the provision of healthcare and authorize for your insurance to be billed (if applicable) for the services provided during this visit. Depending on your insurance coverage, you may receive a charge related to this service.  I need to obtain your verbal consent now. Are you willing to proceed with your visit today? Nichole Wilkins has provided verbal consent on 12/25/2021 for a virtual visit (video or telephone). Piedad Climes, [place]  Date: 12/25/2021 9:43 AM  Virtual Visit via Video Note   I, Piedad Climes, connected with  Nichole Wilkins  ([id number], Mar 14, 1956) on 12/25/21 at  9:30 AM EDT by a video-enabled telemedicine application and verified that I am speaking with the correct person using two identifiers.  Location: Patient: Virtual Visit  Location Patient: Home Provider: Virtual Visit Location Provider: Home Office   I discussed the limitations of evaluation and management by telemedicine and the availability of in person appointments. The patient expressed understanding and agreed to proceed.    History of Present Illness: Nichole Wilkins is a 66 y.o. who identifies as a female who was assigned female at birth, and is being seen today for severe sore throat that has progressively gotten worse. Notes symptoms really picking up on Monday with hoarseness, significant odynophagia. Occasional dry cough and some sinus drainage. Denies fever, chills, aches. Husband with similar symptoms previously. She notes he was evaluated in the ER at [place]. Has to be started on steroids due to level of pain and throat inflammation.   Has taken Tylenol, Nyquil to help with sleep.    HPI: HPI  Problems:  Patient Active Problem List   Diagnosis Date Noted   Prediabetes 12/12/2021   Decreased pedal pulses 04/26/2019   Toe pain 04/26/2019   Encounter for general adult medical examination with abnormal findings 01/17/2018   Postmenopausal bleeding 01/17/2018   Hyperlipidemia 10/04/2015   Rash and nonspecific skin eruption 10/04/2015   Hypertension 08/28/2015   Chronic tension headaches 08/28/2015   Family history of ovarian cancer 08/28/2015   Polyp of skin 08/28/2015    Allergies:  Allergies  Allergen Reactions   Amlodipine Rash   Medications:  Current Outpatient Medications:    lidocaine (XYLOCAINE) 2 % solution, Use as directed 15 mLs in the mouth or throat  every 6 (six) hours as needed for mouth pain., Disp: 200 mL, Rfl: 0   predniSONE (DELTASONE) 20 MG tablet, Take 2 tablets (40 mg total) by mouth daily with breakfast., Disp: 10 tablet, Rfl: 0   hydrochlorothiazide (MICROZIDE) 12.5 MG capsule, Take 1 capsule (12.5 mg total) by mouth daily., Disp: 90 capsule, Rfl: 3  Observations/Objective: Patient is well-developed,  well-nourished in no acute distress.  Resting comfortably in kitchen at home.  Head is normocephalic, atraumatic.  No labored breathing. Speech is clear and coherent with logical content.  Patient is alert and oriented at baseline.  + Oropharyngeal erythema and edema. No UD.   Assessment and Plan: 1. Viral pharyngitis - predniSONE (DELTASONE) 20 MG tablet; Take 2 tablets (40 mg total) by mouth daily with breakfast.  Dispense: 10 tablet; Refill: 0 - lidocaine (XYLOCAINE) 2 % solution; Use as directed 15 mLs in the mouth or throat every 6 (six) hours as needed for mouth pain.  Dispense: 200 mL; Refill: 0  With laryngitis. Supportive measures and OTC medications reviewed in detail. Wills tart prednisone burst for swelling and pain so she can hydrate more and eat more. Start Lidocaine solution to further help with discomfort. Strict in-person evaluation precautions reviewed. Will place antibiotic on file to start if symptoms are not improving over next 72 hours with measures given as she notes her husband now on antibiotic.   Follow Up Instructions: I discussed the assessment and treatment plan with the patient. The patient was provided an opportunity to ask questions and all were answered. The patient agreed with the plan and demonstrated an understanding of the instructions.  A copy of instructions were sent to the patient via MyChart unless otherwise noted below.   The patient was advised to call back or seek an in-person evaluation if the symptoms worsen or if the condition fails to improve as anticipated.  Time:  I spent 10 minutes with the patient via telehealth technology discussing the above problems/concerns.    Piedad Climes, PA-C

## 2021-12-25 NOTE — Patient Instructions (Signed)
  Michaele Offer, thank you for joining Leeanne Rio, PA-C for today's virtual visit.  While this provider is not your primary care provider (PCP), if your PCP is located in our provider database this encounter information will be shared with them immediately following your visit.  Consent: (Patient) Nichole Wilkins provided verbal consent for this virtual visit at the beginning of the encounter.  Current Medications:  Current Outpatient Medications:    hydrochlorothiazide (MICROZIDE) 12.5 MG capsule, Take 1 capsule (12.5 mg total) by mouth daily., Disp: 90 capsule, Rfl: 3   Medications ordered in this encounter:  No orders of the defined types were placed in this encounter.    *If you need refills on other medications prior to your next appointment, please contact your pharmacy*  Follow-Up: Call back or seek an in-person evaluation if the symptoms worsen or if the condition fails to improve as anticipated.  Other Instructions Please keep hydrated and rest.  Continue alternating Tylenol and Motrin for throat pain. Salt-water gargles and a humidifier running in the bedroom at night can be beneficial. Take the steroid as directed. If not substantially improving in next 48-72 hours, start the antibiotic taking as directed.    If you have been instructed to have an in-person evaluation today at a local Urgent Care facility, please use the link below. It will take you to a list of all of our available [facility], including address, phone number and hours of operation. Please do not delay care.  [facility]  If you or a family member do not have a primary care provider, use the link below to schedule a visit and establish care. When you choose a [facility] primary care physician or advanced practice provider, you gain a long-term partner in health. Find a Primary Care Provider  Learn more about [facility]'s in-office and virtual care options: [facility]

## 2022-06-15 ENCOUNTER — Ambulatory Visit: Payer: 59 | Admitting: Family Medicine

## 2022-06-22 ENCOUNTER — Encounter: Payer: Self-pay | Admitting: Family Medicine

## 2022-11-15 ENCOUNTER — Ambulatory Visit
Admission: EM | Admit: 2022-11-15 | Discharge: 2022-11-15 | Disposition: A | Discharge status: Home / Self Care | Payer: 59 | Attending: Internal Medicine | Admitting: Internal Medicine

## 2022-11-15 ENCOUNTER — Encounter: Payer: Self-pay | Admitting: *Deleted

## 2022-11-15 DIAGNOSIS — B0222 Postherpetic trigeminal neuralgia: Secondary | ICD-10-CM | POA: Diagnosis not present

## 2022-11-15 MED ORDER — VALACYCLOVIR HCL 1 G PO TABS: 1000.0000 mg | ORAL_TABLET | Freq: Three times a day (TID) | ORAL | 0 refills | Status: AC

## 2022-11-15 MED ORDER — GABAPENTIN 300 MG PO CAPS: 300.0000 mg | ORAL_CAPSULE | Freq: Every day | ORAL | 0 refills | Status: DC

## 2022-11-15 NOTE — ED Triage Notes (Signed)
Patient reports rash to scalp, forehead, and nose since Tuesday. Reports itching. Unaware of what could have caused. No new products. Patient reports was taking Amoxicillin 500 mg capsules for periodontist implant on the 8th. By 5th day, rash started on my forehead, scalp, and nose. Stopped amoxicillin on 6th day. Started cold compresses and taking antihistamine.

## 2022-11-15 NOTE — Discharge Instructions (Addendum)
You have shingles affecting right side of your forehead Please take medications as prescribed The involvement of your forehead means that it could also affect your eyes.  Currently, it does not appear that your eyes are involved but if you notice any light sensitivity, eye pain or sensation of foreign body in your eye please come to urgent care to be reevaluated Please take gabapentin at bedtime, it may make you drowsy so do not drive or operate heavy machinery after taking that. You may also take Tylenol or ibuprofen as needed for pain. Please return to urgent care if you have worsening symptoms

## 2022-11-15 NOTE — ED Provider Notes (Signed)
[facility]    CSN: [id number] Arrival date & time: 11/15/22  1151      History   Chief Complaint Chief Complaint  Patient presents with   Rash    Had an implant at periodontist April 8th. Was given Amoxicillin 500 mg capsules. By 5th day,  rash started on my forehead, scalp, and nose. Stopped amoxicillin on 6th day. Started cold compresses and taking antihistamine. Need to clean up rash. - Entered by patient    HPI Nichole Wilkins is a 67 y.o. female comes to urgent care with painful rash over her forehead.  Pain over her forehead started on Tuesday.  The pain was mainly musculoskeletal and burning in nature.  Soon after that patient noticed some blistering rash on the right side of her forehead starting from the hairline.  This rash has been coming in crops and extended to the right side of her nose.  No light sensitivity.  No pain in her eye.  Patient recently underwent a dental procedure and was prescribed amoxicillin.  No prior history of allergy to amoxicillin.  Patient denies history of chickenpox.  No fever or chills.Marland Kitchen   HPI  Past Medical History:  Diagnosis Date   Allergic rhinitis    Chickenpox    Elevated blood pressure    High cholesterol    Hypertension    Migraines     Patient Active Problem List   Diagnosis Date Noted   Prediabetes 12/12/2021   Decreased pedal pulses 04/26/2019   Toe pain 04/26/2019   Encounter for general adult medical examination with abnormal findings 01/17/2018   Postmenopausal bleeding 01/17/2018   Hyperlipidemia 10/04/2015   Rash and nonspecific skin eruption 10/04/2015   Hypertension 08/28/2015   Chronic tension headaches 08/28/2015   Family history of ovarian cancer 08/28/2015   Polyp of skin 08/28/2015    Past Surgical History:  Procedure Laterality Date   AUGMENTATION MAMMAPLASTY Bilateral 2000   BREAST ENHANCEMENT SURGERY  2000   TUBAL LIGATION  1994    OB History     Gravida  5   Para  5   Term  5    Preterm  0   AB  0   Living  5      SAB  0   IAB  0   Ectopic  0   Multiple  0   Live Births  5            Home Medications    Prior to Admission medications   Medication Sig Start Date End Date Taking? Authorizing Provider  gabapentin (NEURONTIN) 300 MG capsule Take 1 capsule (300 mg total) by mouth at bedtime for 7 days. 11/15/22 11/22/22 Yes Reynald Woods, Britta Mccreedy, MD  valACYclovir (VALTREX) 1000 MG tablet Take 1 tablet (1,000 mg total) by mouth 3 (three) times daily for 7 days. 11/15/22 11/22/22 Yes Selia Wareing, Britta Mccreedy, MD  hydrochlorothiazide (MICROZIDE) 12.5 MG capsule Take 1 capsule (12.5 mg total) by mouth daily. 12/12/21   Glori Luis, MD    Family History Family History  Problem Relation Age of Onset   Ovarian cancer Sister 75   Cancer - Other Mother 9       Vulvar    Cervical cancer Sister 73   Cancer - Other Son 27       Testicular   Breast cancer Maternal Grandmother    Stroke Other        Parent   Heart attack Father 59  High blood pressure Other        Parent, other relative   Cancer - Other Paternal Grandfather        unknown    Social History Social History   Tobacco Use   Smoking status: Never   Smokeless tobacco: Never  Vaping Use   Vaping Use: Never used  Substance Use Topics   Alcohol use: No    Alcohol/week: 0.0 standard drinks of alcohol   Drug use: No     Allergies   Amlodipine   Review of Systems Review of Systems As per HPI  Physical Exam Triage Vital Signs ED Triage Vitals  Enc Vitals Group     BP 11/15/22 1228 (!) 148/95     Pulse Rate 11/15/22 1228 70     Resp 11/15/22 1228 16     Temp 11/15/22 1228 98.6 F (37 C)     Temp Source 11/15/22 1228 Oral     SpO2 11/15/22 1228 95 %     Weight --      Height --      Head Circumference --      Peak Flow --      Pain Score 11/15/22 1229 4     Pain Loc --      Pain Edu? --      Excl. in GC? --    No data found.  Updated Vital Signs BP (!) 148/95 (BP  Location: Right Arm)   Pulse 70   Temp 98.6 F (37 C) (Oral)   Resp 16   SpO2 95%   Visual Acuity Right Eye Distance:   Left Eye Distance:   Bilateral Distance:    Right Eye Near:   Left Eye Near:    Bilateral Near:     Physical Exam Vitals and nursing note reviewed.  Constitutional:      General: She is not in acute distress.    Appearance: Normal appearance. She is not ill-appearing.  HENT:     Right Ear: Tympanic membrane normal.     Left Ear: Tympanic membrane normal.     Mouth/Throat:     Mouth: Mucous membranes are moist.     Pharynx: No oropharyngeal exudate or posterior oropharyngeal erythema.  Eyes:     Extraocular Movements: Extraocular movements intact.     Conjunctiva/sclera: Conjunctivae normal.     Pupils: Pupils are equal, round, and reactive to light.     Comments: No corneal haziness.  Right forehead lesions have scabbed over.  Patient has vesicular rash on the right side of the nose.  Vesicular rash is rounded by a thin rim of erythema.  Cardiovascular:     Rate and Rhythm: Normal rate and regular rhythm.  Neurological:     Mental Status: She is alert.      UC Treatments / Results  Labs (all labs ordered are listed, but only abnormal results are displayed) Labs Reviewed - No data to display  EKG   Radiology No results found.  Procedures Procedures (including critical care time)  Medications Ordered in UC Medications - No data to display  Initial Impression / Assessment and Plan / UC Course  I have reviewed the triage vital signs and the nursing notes.  Pertinent labs & imaging results that were available during my care of the patient were reviewed by me and considered in my medical decision making (see chart for details).     1.  Trigeminal herpes zoster: Valacyclovir 1 g 3 times daily for  7 days Gabapentin 300 mg at bedtime Ibuprofen as needed for pain Return precautions given. Final Clinical Impressions(s) / UC Diagnoses    Final diagnoses:  Trigeminal herpes zoster     Discharge Instructions      You have shingles affecting right side of your forehead Please take medications as prescribed The involvement of your forehead means that it could also affect your eyes.  Currently, it does not appear that your eyes are involved but if you notice any light sensitivity, eye pain or sensation of foreign body in your eye please come to urgent care to be reevaluated Please take gabapentin at bedtime, it may make you drowsy so do not drive or operate heavy machinery after taking that. You may also take Tylenol or ibuprofen as needed for pain. Please return to urgent care if you have worsening symptoms   ED Prescriptions     Medication Sig Dispense Auth. Provider   valACYclovir (VALTREX) 1000 MG tablet Take 1 tablet (1,000 mg total) by mouth 3 (three) times daily for 7 days. 21 tablet Karam Dunson, Britta Mccreedy, MD   gabapentin (NEURONTIN) 300 MG capsule Take 1 capsule (300 mg total) by mouth at bedtime for 7 days. 7 capsule Breelyn Icard, Britta Mccreedy, MD      PDMP not reviewed this encounter.   Merrilee Jansky, MD 11/15/22 [phone]

## 2022-12-29 ENCOUNTER — Other Ambulatory Visit: Payer: Self-pay | Admitting: Family Medicine

## 2022-12-29 DIAGNOSIS — I10 Essential (primary) hypertension: Secondary | ICD-10-CM

## 2022-12-29 NOTE — Telephone Encounter (Signed)
LMTCB. Need to schedule pt for an appt. Last appt was 12/06/2021.

## 2023-01-01 ENCOUNTER — Encounter: Payer: Self-pay | Admitting: *Deleted

## 2023-01-01 NOTE — Telephone Encounter (Signed)
Sent my chart could not reach by phone needs appointment.

## 2023-03-11 ENCOUNTER — Encounter (INDEPENDENT_AMBULATORY_CARE_PROVIDER_SITE_OTHER): Payer: Self-pay

## 2023-04-07 ENCOUNTER — Encounter: Payer: Self-pay | Admitting: Family Medicine

## 2023-04-07 ENCOUNTER — Other Ambulatory Visit (HOSPITAL_COMMUNITY)
Admission: RE | Admit: 2023-04-07 | Discharge: 2023-04-07 | Disposition: A | Discharge status: Home / Self Care | Payer: 59 | Source: Ambulatory Visit | Attending: Family Medicine | Admitting: Family Medicine

## 2023-04-07 ENCOUNTER — Ambulatory Visit (INDEPENDENT_AMBULATORY_CARE_PROVIDER_SITE_OTHER): Payer: 59 | Admitting: Family Medicine

## 2023-04-07 ENCOUNTER — Telehealth: Payer: Self-pay

## 2023-04-07 VITALS — BP 122/74 | HR 62 | Temp 98.0°F | Ht 67.0 in | Wt 193.8 lb

## 2023-04-07 DIAGNOSIS — R7303 Prediabetes: Secondary | ICD-10-CM

## 2023-04-07 DIAGNOSIS — Z23 Encounter for immunization: Secondary | ICD-10-CM | POA: Diagnosis not present

## 2023-04-07 DIAGNOSIS — Z0001 Encounter for general adult medical examination with abnormal findings: Secondary | ICD-10-CM

## 2023-04-07 DIAGNOSIS — E785 Hyperlipidemia, unspecified: Secondary | ICD-10-CM | POA: Diagnosis not present

## 2023-04-07 DIAGNOSIS — M2040 Other hammer toe(s) (acquired), unspecified foot: Secondary | ICD-10-CM | POA: Insufficient documentation

## 2023-04-07 DIAGNOSIS — Z124 Encounter for screening for malignant neoplasm of cervix: Secondary | ICD-10-CM | POA: Diagnosis present

## 2023-04-07 DIAGNOSIS — M2042 Other hammer toe(s) (acquired), left foot: Secondary | ICD-10-CM | POA: Diagnosis not present

## 2023-04-07 DIAGNOSIS — M2041 Other hammer toe(s) (acquired), right foot: Secondary | ICD-10-CM

## 2023-04-07 DIAGNOSIS — Z1231 Encounter for screening mammogram for malignant neoplasm of breast: Secondary | ICD-10-CM

## 2023-04-07 DIAGNOSIS — Z780 Asymptomatic menopausal state: Secondary | ICD-10-CM

## 2023-04-07 LAB — COMPREHENSIVE METABOLIC PANEL
ALT: 21 U/L (ref 0–35)
AST: 28 U/L (ref 0–37)
Albumin: 4.2 g/dL (ref 3.5–5.2)
Alkaline Phosphatase: 70 U/L (ref 39–117)
BUN: 10 mg/dL (ref 6–23)
CO2: 29 meq/L (ref 19–32)
Calcium: 9.9 mg/dL (ref 8.4–10.5)
Chloride: 99 meq/L (ref 96–112)
Creatinine, Ser: 0.94 mg/dL (ref 0.40–1.20)
GFR: 63.62 mL/min (ref >60.00)
Glucose, Bld: 78 mg/dL (ref 70–99)
Potassium: 4.2 meq/L (ref 3.5–5.1)
Sodium: 135 meq/L (ref 135–145)
Total Bilirubin: 0.7 mg/dL (ref 0.2–1.2)
Total Protein: 7.6 g/dL (ref 6.0–8.3)

## 2023-04-07 LAB — LIPID PANEL
Cholesterol: 240 mg/dL — ABNORMAL HIGH (ref 0–200)
HDL: 52.5 mg/dL (ref >39.00)
LDL Cholesterol: 164 mg/dL — ABNORMAL HIGH (ref 0–99)
NonHDL: 187.99
Total CHOL/HDL Ratio: 5
Triglycerides: 121 mg/dL (ref 0.0–149.0)
VLDL: 24.2 mg/dL (ref 0.0–40.0)

## 2023-04-07 LAB — HEMOGLOBIN A1C: Hgb A1c MFr Bld: 5.7 % (ref 4.6–6.5)

## 2023-04-07 NOTE — Telephone Encounter (Signed)
-----   Message from Marikay Alar sent at 04/07/2023  3:53 PM EDT ----- Please let the patient know her cholesterol is higher than it has been.  She meets criteria to go on a statin to help reduce her risk of stroke and heart attack and help reduce her cholesterol.  If she is willing to do this I can send in Crestor 20 mg daily and she would need repeat labs in 6 weeks.  Her A1c is in the prediabetic range.  She needs to continue with her dietary changes and add an additional exercise to help with this.  The 10-year ASCVD risk score (Arnett DK, et al., 2019) is: 7.6%   Values used to calculate the score:     Age: 67 years     Sex: Female     Is Non-Hispanic African American: No     Diabetic: No     Tobacco smoker: No     Systolic Blood Pressure: 122 mmHg     Is BP treated: Yes     HDL Cholesterol: 52.5 mg/dL     Total Cholesterol: 240 mg/dL

## 2023-04-07 NOTE — Telephone Encounter (Signed)
 Left message to call the office back regarding the lab results below.

## 2023-04-07 NOTE — Assessment & Plan Note (Signed)
Noted bilateral second toes.  This is not bothering her to a significant degree.  Advised to monitor for now.  Discussed she could use padding to alleviate any discomfort from pressure on her toes.

## 2023-04-07 NOTE — Assessment & Plan Note (Signed)
Physical exam completed.  She will continue with healthy diet.  Encouraged to increase exercise to 3 times a week.  Prevnar 20 given today.  She will get Shingrix vaccine at the pharmacy per her request.  She will call to schedule her mammogram and bone density scan.  Patient declines flu vaccine, COVID-vaccine.  Lab work as outlined.

## 2023-04-07 NOTE — Progress Notes (Signed)
Marikay Alar, MD Phone: [phone]  Nichole Wilkins is a 67 y.o. female who presents today for CPE.  Diet: Over the last month patient has cut out snacks and sugar, has replaced this with fruit.  Has cut back on carbohydrates.  No soda or sweet tea.  Gets vegetables at dinner.  Has been grilling her meats more. Exercise: Gets on the treadmill and does some weight machines 1 time a week Pap smear: Due Colonoscopy: Due next year Mammogram: Due Family history-  Colon cancer: No  Breast cancer: Maternal grandmother  Ovarian cancer: Sister, patient did have genetic screening with gynecology several years ago Menses: Postmenopausal Vaccines-   Flu: Declines  Tetanus: Up-to-date  Shingles: Due  COVID19: Declines  Pneumonia: Due HIV screening: Notes has had this in the past Hep C Screening: Up-to-date Tobacco use: no Alcohol use: no Illicit Drug use: no Dentist: yes Ophthalmology: yes  Bilateral second toe pain: Patient notes second toes bilaterally have some discomfort.  They are sticking up and slightly crooked.  Started in her right foot though her left foot has become an issue recently.   Active Ambulatory Problems    Diagnosis Date Noted   Hypertension 08/28/2015   Family history of ovarian cancer 08/28/2015   Polyp of skin 08/28/2015   Hyperlipidemia 10/04/2015   Encounter for general adult medical examination with abnormal findings 01/17/2018   Postmenopausal bleeding 01/17/2018   Toe pain 04/26/2019   Prediabetes 12/12/2021   Hammer toe 04/07/2023   Resolved Ambulatory Problems    Diagnosis Date Noted   Chronic tension headaches 08/28/2015   Rash and nonspecific skin eruption 10/04/2015   Decreased pedal pulses 04/26/2019   Past Medical History:  Diagnosis Date   Allergic rhinitis    Chickenpox    Elevated blood pressure    High cholesterol    Migraines     Family History  Problem Relation Age of Onset   Ovarian cancer Sister 41   Cancer - Other  Mother 34       Vulvar    Cervical cancer Sister 46   Cancer - Other Son 27       Testicular   Breast cancer Maternal Grandmother    Stroke Other        Parent   Heart attack Father 43   High blood pressure Other        Parent, other relative   Cancer - Other Paternal Grandfather        unknown    Social History   Socioeconomic History   Marital status: Married    Spouse name: Not on file   Number of children: Not on file   Years of education: Not on file   Highest education level: Associate degree: academic program  Occupational History   Not on file  Tobacco Use   Smoking status: Never   Smokeless tobacco: Never  Vaping Use   Vaping status: Never Used  Substance and Sexual Activity   Alcohol use: No    Alcohol/week: 0.0 standard drinks of alcohol   Drug use: No   Sexual activity: Yes    Birth control/protection: Post-menopausal  Other Topics Concern   Not on file  Social History Narrative   Not on file   Social Determinants of Health   Financial Resource Strain: Low Risk  (04/06/2023)   Overall Financial Resource Strain (CARDIA)    Difficulty of Paying Living Expenses: Not hard at all  Food Insecurity: No Food Insecurity (04/06/2023)  Hunger Vital Sign    Worried About Running Out of Food in the Last Year: Never true    Ran Out of Food in the Last Year: Never true  Transportation Needs: No Transportation Needs (04/06/2023)   PRAPARE - Administrator, Civil Service (Medical): No    Lack of Transportation (Non-Medical): No  Physical Activity: Insufficiently Active (04/06/2023)   Exercise Vital Sign    Days of Exercise per Week: 1 day    Minutes of Exercise per Session: 40 min  Stress: No Stress Concern Present (04/06/2023)   [employer] of Occupational Health - Occupational Stress Questionnaire    Feeling of Stress : Only a little  Social Connections: Socially Integrated (04/06/2023)   Social Connection and Isolation Panel [NHANES]     Frequency of Communication with Friends and Family: More than three times a week    Frequency of Social Gatherings with Friends and Family: Twice a week    Attends Religious Services: More than 4 times per year    Active Member of [employer] or Organizations: Yes    Attends Engineer, structural: More than 4 times per year    Marital Status: Married  Catering manager Violence: Not on file    ROS  General:  Negative for nexplained weight loss, fever Skin: Negative for new or changing mole, sore that won't heal HEENT: Negative for trouble hearing, trouble seeing, ringing in ears, mouth sores, hoarseness, change in voice, dysphagia. CV:  Negative for chest pain, dyspnea, edema, palpitations Resp: Negative for cough, dyspnea, hemoptysis GI: Negative for nausea, vomiting, diarrhea, constipation, abdominal pain, melena, hematochezia. GU: Negative for dysuria, incontinence, urinary hesitance, hematuria, vaginal or penile discharge, polyuria, sexual difficulty, lumps in testicle or breasts MSK: Negative for muscle cramps or aches, joint pain or swelling Neuro: Negative for headaches, weakness, numbness, dizziness, passing out/fainting Psych: Negative for depression, anxiety, memory problems  Objective  Physical Exam Vitals:   04/07/23 0812  BP: 122/74  Pulse: 62  Temp: 98 F (36.7 C)  SpO2: 99%    BP Readings from Last 3 Encounters:  04/07/23 122/74  11/15/22 (!) 148/95  12/12/21 120/79   Wt Readings from Last 3 Encounters:  04/07/23 193 lb 12.8 oz (87.9 kg)  12/12/21 174 lb 3.2 oz (79 kg)  04/26/19 174 lb 6.4 oz (79.1 kg)    Physical Exam Constitutional:      General: She is not in acute distress.    Appearance: She is not diaphoretic.  Cardiovascular:     Rate and Rhythm: Normal rate and regular rhythm.     Heart sounds: Normal heart sounds.  Pulmonary:     Effort: Pulmonary effort is normal.     Breath sounds: Normal breath sounds.  Abdominal:     General: Bowel  sounds are normal. There is no distension.     Palpations: Abdomen is soft.     Tenderness: There is no abdominal tenderness.  Genitourinary:    Comments: Prince Solian, CMA served as chaperone, normal labia, vaginal mucosa is atrophic, normal-appearing cervix, no adnexal masses on bimanual exam, cervix is midline, skin tags noted from likely prior hemorrhoids (patient confirms history of hemorrhoids) Musculoskeletal:     Right lower leg: No edema.     Left lower leg: No edema.     Comments: Hammertoe bilateral second toes worse on right foot compared to left, nontender  Lymphadenopathy:     Cervical: No cervical adenopathy.  Skin:    General: Skin is  warm and dry.  Neurological:     Mental Status: She is alert.  Psychiatric:        Mood and Affect: Mood normal.      Assessment/Plan:   Encounter for general adult medical examination with abnormal findings Assessment & Plan: Physical exam completed.  She will continue with healthy diet.  Encouraged to increase exercise to 3 times a week.  Prevnar 20 given today.  She will get Shingrix vaccine at the pharmacy per her request.  She will call to schedule her mammogram and bone density scan.  Patient declines flu vaccine, COVID-vaccine.  Lab work as outlined.   Hyperlipidemia, unspecified hyperlipidemia type -     Comprehensive metabolic panel -     Lipid panel  Prediabetes -     Hemoglobin A1c  Hammer toes of both feet Assessment & Plan: Noted bilateral second toes.  This is not bothering her to a significant degree.  Advised to monitor for now.  Discussed she could use padding to alleviate any discomfort from pressure on her toes.   Encounter for screening mammogram for malignant neoplasm of breast -     3D Screening Mammogram, Left and Right; Future  Postmenopausal estrogen deficiency -     DG Bone Density; Future  Cervical cancer screening -     Cytology - PAP  Encounter for administration of vaccine -      Pneumococcal conjugate vaccine 20-valent    Return in about 1 year (around 04/06/2024) for physical.   Marikay Alar, MD [facility] Primary [facility]

## 2023-04-07 NOTE — Patient Instructions (Signed)
Nice to see you. Please call [phone] to schedule your mammogram and bone density scan. Will get lab work today and contact you with the results. Please consider getting the Shingrix vaccine at the pharmacy.

## 2023-04-08 LAB — CYTOLOGY - PAP
Comment: NEGATIVE
Diagnosis: NEGATIVE
High risk HPV: NEGATIVE

## 2023-04-14 ENCOUNTER — Telehealth: Payer: Self-pay

## 2023-04-14 NOTE — Telephone Encounter (Signed)
Lvm for pt to give office a call back in regards to lab results

## 2023-04-14 NOTE — Telephone Encounter (Signed)
-----  Message from Marikay Alar sent at 04/07/2023  3:53 PM EDT ----- Please let the patient know her cholesterol is higher than it has been.  She meets criteria to go on a statin to help reduce her risk of stroke and heart attack and help reduce her cholesterol.  If she is willing to do this I can send in Crestor 20 mg daily and she would need repeat labs in 6 weeks.  Her A1c is in the prediabetic range.  She needs to continue with her dietary changes and add an additional exercise to help with this.  The 10-year ASCVD risk score (Arnett DK, et al., 2019) is: 7.6%   Values used to calculate the score:     Age: 67 years     Sex: Female     Is Non-Hispanic African American: No     Diabetic: No     Tobacco smoker: No     Systolic Blood Pressure: 122 mmHg     Is BP treated: Yes     HDL Cholesterol: 52.5 mg/dL     Total Cholesterol: 240 mg/dL

## 2023-04-17 ENCOUNTER — Other Ambulatory Visit: Payer: Self-pay | Admitting: Family Medicine

## 2023-04-17 DIAGNOSIS — I10 Essential (primary) hypertension: Secondary | ICD-10-CM

## 2023-04-22 MED ORDER — ROSUVASTATIN CALCIUM 20 MG PO TABS: 20.0000 mg | ORAL_TABLET | Freq: Every day | ORAL | 3 refills | Status: DC

## 2023-04-22 NOTE — Addendum Note (Signed)
Addended by: Prince Solian A on: 04/22/2023 04:44 PM   Modules accepted: Orders

## 2023-04-23 ENCOUNTER — Other Ambulatory Visit: Payer: Self-pay | Admitting: Family Medicine

## 2023-04-23 DIAGNOSIS — E785 Hyperlipidemia, unspecified: Secondary | ICD-10-CM

## 2023-04-26 NOTE — Telephone Encounter (Signed)
-----   Message from Marikay Alar sent at 04/23/2023 12:37 PM EDT ----- Noted.  Thanks for sending this in.  She does need lab work in 6 weeks.  Please advise her if she develops any muscle aches while on the Crestor she should let us know.  Orders have been placed for labs.

## 2023-04-26 NOTE — Telephone Encounter (Signed)
Lvm and mychart message sent.

## 2023-04-28 ENCOUNTER — Other Ambulatory Visit: Payer: Self-pay | Admitting: Family Medicine

## 2023-04-28 DIAGNOSIS — N6489 Other specified disorders of breast: Secondary | ICD-10-CM

## 2023-05-14 ENCOUNTER — Ambulatory Visit
Admission: RE | Admit: 2023-05-14 | Discharge: 2023-05-14 | Disposition: A | Discharge status: Home / Self Care | Payer: 59 | Source: Ambulatory Visit | Attending: Family Medicine | Admitting: Family Medicine

## 2023-05-14 DIAGNOSIS — N6489 Other specified disorders of breast: Secondary | ICD-10-CM | POA: Insufficient documentation

## 2023-05-22 ENCOUNTER — Encounter: Payer: Self-pay | Admitting: Family Medicine

## 2023-05-25 NOTE — Telephone Encounter (Signed)
Lvm for pt to schedule appt to see provider

## 2023-05-25 NOTE — Telephone Encounter (Signed)
Can you contact the patient and get her scheduled for a follow-up in our office for this issue?  Need to determine if she is having significant vision changes with this or any other eye issues to determine how quickly she needs to see the eye doctor.  Thanks.

## 2023-05-26 ENCOUNTER — Ambulatory Visit (INDEPENDENT_AMBULATORY_CARE_PROVIDER_SITE_OTHER): Payer: 59 | Admitting: Family Medicine

## 2023-05-26 ENCOUNTER — Encounter: Payer: Self-pay | Admitting: Family Medicine

## 2023-05-26 VITALS — BP 130/78 | HR 63 | Temp 98.1°F | Ht 67.0 in | Wt 200.4 lb

## 2023-05-26 DIAGNOSIS — H43392 Other vitreous opacities, left eye: Secondary | ICD-10-CM | POA: Insufficient documentation

## 2023-05-26 NOTE — Assessment & Plan Note (Signed)
Patient's description is consistent with floaters.  Patient has not had an excessive number of floaters and has not noticed any other symptoms.  Discussed that floaters are typically benign though if she has numerous floaters or flashes of light or vision changes this could indicate a retinal detachment.  Advised if she developed those symptoms she needs to contact us immediately.  We will refer to ophthalmology for an exam to make sure nothing else is going on to cause the floaters.

## 2023-05-26 NOTE — Progress Notes (Signed)
  Marikay Alar, MD Phone: [phone]  Nichole Wilkins is a 67 y.o. female who presents today for same-day visit.  Black spot in her vision: Patient notes this started 5 days ago.  She noted what she thought was a and crawling on her desk.  She subsequently saw another black spot.  She notes only 2 black spots have come up.  There has not been a shower of black spots.  No vision changes.  No eye pain.  No blurry vision.  No flashes of light in her vision.  Social History   Tobacco Use  Smoking Status Never  Smokeless Tobacco Never    Current Outpatient Medications on File Prior to Visit  Medication Sig Dispense Refill   hydrochlorothiazide (MICROZIDE) 12.5 MG capsule TAKE 1 CAPSULE BY MOUTH EVERY DAY 90 capsule 0   rosuvastatin (CRESTOR) 20 MG tablet Take 1 tablet (20 mg total) by mouth daily. 90 tablet 3   No current facility-administered medications on file prior to visit.     ROS see history of present illness  Objective  Physical Exam Vitals:   05/26/23 1108  BP: 130/78  Pulse: 63  Temp: 98.1 F (36.7 C)  SpO2: 97%    BP Readings from Last 3 Encounters:  05/26/23 130/78  04/07/23 122/74  11/15/22 (!) 148/95   Wt Readings from Last 3 Encounters:  05/26/23 200 lb 6.4 oz (90.9 kg)  04/07/23 193 lb 12.8 oz (87.9 kg)  12/12/21 174 lb 3.2 oz (79 kg)    Physical Exam Eyes:     Conjunctiva/sclera: Conjunctivae normal.     Pupils: Pupils are equal, round, and reactive to light.     Comments: Fundus exam attempted though difficult to complete given pupillary constriction      Assessment/Plan: Please see individual problem list.  Floaters in visual field, left Assessment & Plan: Patient's description is consistent with floaters.  Patient has not had an excessive number of floaters and has not noticed any other symptoms.  Discussed that floaters are typically benign though if she has numerous floaters or flashes of light or vision changes this could indicate  a retinal detachment.  Advised if she developed those symptoms she needs to contact us immediately.  We will refer to ophthalmology for an exam to make sure nothing else is going on to cause the floaters.  Orders: -     Ambulatory referral to Ophthalmology     Return if symptoms worsen or fail to improve.   Marikay Alar, MD [facility] Primary Care [facility]

## 2023-06-08 ENCOUNTER — Encounter: Payer: Self-pay | Admitting: Family Medicine

## 2023-06-08 ENCOUNTER — Ambulatory Visit (INDEPENDENT_AMBULATORY_CARE_PROVIDER_SITE_OTHER): Payer: 59 | Admitting: Family Medicine

## 2023-06-08 VITALS — BP 122/74 | HR 68 | Temp 98.4°F | Ht 67.0 in | Wt 195.6 lb

## 2023-06-08 DIAGNOSIS — E785 Hyperlipidemia, unspecified: Secondary | ICD-10-CM | POA: Diagnosis not present

## 2023-06-08 DIAGNOSIS — H43392 Other vitreous opacities, left eye: Secondary | ICD-10-CM | POA: Diagnosis not present

## 2023-06-08 LAB — LIPID PANEL
Cholesterol: 142 mg/dL (ref 0–200)
HDL: 58.6 mg/dL (ref >39.00)
LDL Cholesterol: 69 mg/dL (ref 0–99)
NonHDL: 83.3
Total CHOL/HDL Ratio: 2
Triglycerides: 74 mg/dL (ref 0.0–149.0)
VLDL: 14.8 mg/dL (ref 0.0–40.0)

## 2023-06-08 LAB — HEPATIC FUNCTION PANEL
ALT: 22 U/L (ref 0–35)
AST: 28 U/L (ref 0–37)
Albumin: 4.4 g/dL (ref 3.5–5.2)
Alkaline Phosphatase: 63 U/L (ref 39–117)
Bilirubin, Direct: 0.1 mg/dL (ref 0.0–0.3)
Total Bilirubin: 0.6 mg/dL (ref 0.2–1.2)
Total Protein: 7.2 g/dL (ref 6.0–8.3)

## 2023-06-08 NOTE — Progress Notes (Signed)
  Marikay Alar, MD Phone: [phone]  Nichole Wilkins is a 67 y.o. female who presents today for f/u.  HYPERLIPIDEMIA Symptoms Chest pain on exertion:  no   Leg claudication:   no Medications: Compliance- taking crestor Right upper quadrant pain- no  Muscle aches- no Lipid Panel     Component Value Date/Time   CHOL 240 (H) 04/07/2023 0859   TRIG 121.0 04/07/2023 0859   HDL 52.50 04/07/2023 0859   CHOLHDL 5 04/07/2023 0859   VLDL 24.2 04/07/2023 0859   LDLCALC 164 (H) 04/07/2023 0859   LDLCALC 132 (H) 12/12/2021 1432   LDLDIRECT 66.0 02/17/2018 0851   Floaters: Patient reports she saw ophthalmology.  They noted they were just floaters.  She notes they advised her there may be some that become more prominent with time.  Social History   Tobacco Use  Smoking Status Never  Smokeless Tobacco Never    Current Outpatient Medications on File Prior to Visit  Medication Sig Dispense Refill   hydrochlorothiazide (MICROZIDE) 12.5 MG capsule TAKE 1 CAPSULE BY MOUTH EVERY DAY 90 capsule 0   rosuvastatin (CRESTOR) 20 MG tablet Take 1 tablet (20 mg total) by mouth daily. 90 tablet 3   No current facility-administered medications on file prior to visit.     ROS see history of present illness  Objective  Physical Exam Vitals:   06/08/23 0838  BP: 122/74  Pulse: 68  Temp: 98.4 F (36.9 C)  SpO2: 97%    BP Readings from Last 3 Encounters:  06/08/23 122/74  05/26/23 130/78  04/07/23 122/74   Wt Readings from Last 3 Encounters:  06/08/23 195 lb 9.6 oz (88.7 kg)  05/26/23 200 lb 6.4 oz (90.9 kg)  04/07/23 193 lb 12.8 oz (87.9 kg)    Physical Exam Constitutional:      General: She is not in acute distress. Cardiovascular:     Rate and Rhythm: Normal rate and regular rhythm.  Neurological:     Mental Status: She is alert.      Assessment/Plan: Please see individual problem list.  Hyperlipidemia, unspecified hyperlipidemia type Assessment & Plan: Chronic  issue.  Patient will continue Crestor 20 mg daily.  We will check labs today.  Orders: -     Lipid panel -     Hepatic function panel  Floaters in visual field, left Assessment & Plan: Found to be floaters by ophthalmology.  Patient will monitor.     Return in about 10 months (around 04/07/2024) for With new provider in [place].   Marikay Alar, MD [facility] Primary Care [facility]

## 2023-06-08 NOTE — Assessment & Plan Note (Signed)
Chronic issue.  Patient will continue Crestor 20 mg daily.  We will check labs today.

## 2023-06-08 NOTE — Assessment & Plan Note (Signed)
Found to be floaters by ophthalmology.  Patient will monitor.

## 2023-07-19 ENCOUNTER — Other Ambulatory Visit: Payer: Self-pay | Admitting: Family Medicine

## 2023-07-19 DIAGNOSIS — I10 Essential (primary) hypertension: Secondary | ICD-10-CM

## 2023-10-12 ENCOUNTER — Telehealth: Payer: Self-pay | Admitting: Family Medicine

## 2023-10-12 NOTE — Telephone Encounter (Signed)
 Dr Birdie Sons is no longer at this location. Please call the office to schedule a Transfer of Care to either Dr Charlann Lange, Darleen Crocker or Kara Dies, NP. Purvis Sheffield, please schedule a TOC visit for this patient. [facility]   Thank you

## 2024-03-14 ENCOUNTER — Telehealth: Payer: Self-pay | Admitting: Nurse Practitioner

## 2024-03-14 NOTE — Telephone Encounter (Signed)
 Left vm and MyChart message for pt to reschedule TOC visit from 05/19/24.   E2C2, please reschedule or transfer pt to front office when they call back. [facility]

## 2024-04-10 ENCOUNTER — Encounter: Payer: 59 | Admitting: Family Medicine

## 2024-05-12 ENCOUNTER — Ambulatory Visit: Admitting: Internal Medicine

## 2024-05-12 VITALS — BP 136/82 | HR 65 | Temp 98.8°F | Ht 67.0 in | Wt 205.8 lb

## 2024-05-12 DIAGNOSIS — Z0000 Encounter for general adult medical examination without abnormal findings: Secondary | ICD-10-CM | POA: Diagnosis not present

## 2024-05-12 DIAGNOSIS — E669 Obesity, unspecified: Secondary | ICD-10-CM

## 2024-05-12 DIAGNOSIS — R29818 Other symptoms and signs involving the nervous system: Secondary | ICD-10-CM | POA: Diagnosis not present

## 2024-05-12 DIAGNOSIS — Z1211 Encounter for screening for malignant neoplasm of colon: Secondary | ICD-10-CM | POA: Diagnosis not present

## 2024-05-12 DIAGNOSIS — Z8041 Family history of malignant neoplasm of ovary: Secondary | ICD-10-CM

## 2024-05-12 DIAGNOSIS — E785 Hyperlipidemia, unspecified: Secondary | ICD-10-CM

## 2024-05-12 DIAGNOSIS — Z0001 Encounter for general adult medical examination with abnormal findings: Secondary | ICD-10-CM

## 2024-05-12 DIAGNOSIS — I10 Essential (primary) hypertension: Secondary | ICD-10-CM

## 2024-05-12 LAB — LIPID PANEL
Cholesterol: 203 mg/dL — ABNORMAL HIGH (ref 0–200)
HDL: 53.4 mg/dL (ref >39.00)
LDL Cholesterol: 126 mg/dL — ABNORMAL HIGH (ref 0–99)
NonHDL: 149.21
Total CHOL/HDL Ratio: 4
Triglycerides: 114 mg/dL (ref 0.0–149.0)
VLDL: 22.8 mg/dL (ref 0.0–40.0)

## 2024-05-12 LAB — CBC WITH DIFFERENTIAL/PLATELET
Basophils Absolute: 0 K/uL (ref 0.0–0.1)
Basophils Relative: 0.5 % (ref 0.0–3.0)
Eosinophils Absolute: 0.1 K/uL (ref 0.0–0.7)
Eosinophils Relative: 1.4 % (ref 0.0–5.0)
HCT: 39.5 % (ref 36.0–46.0)
Hemoglobin: 13.6 g/dL (ref 12.0–15.0)
Lymphocytes Relative: 32.6 % (ref 12.0–46.0)
Lymphs Abs: 2.1 K/uL (ref 0.7–4.0)
MCHC: 34.5 g/dL (ref 30.0–36.0)
MCV: 93.7 fl (ref 78.0–100.0)
Monocytes Absolute: 0.5 K/uL (ref 0.1–1.0)
Monocytes Relative: 8 % (ref 3.0–12.0)
Neutro Abs: 3.7 K/uL (ref 1.4–7.7)
Neutrophils Relative %: 57.5 % (ref 43.0–77.0)
Platelets: 237 K/uL (ref 150.0–400.0)
RBC: 4.22 Mil/uL (ref 3.87–5.11)
RDW: 13.1 % (ref 11.5–15.5)
WBC: 6.3 K/uL (ref 4.0–10.5)

## 2024-05-12 LAB — COMPREHENSIVE METABOLIC PANEL WITH GFR
ALT: 26 U/L (ref 0–35)
AST: 28 U/L (ref 0–37)
Albumin: 4.5 g/dL (ref 3.5–5.2)
Alkaline Phosphatase: 66 U/L (ref 39–117)
BUN: 14 mg/dL (ref 6–23)
CO2: 29 meq/L (ref 19–32)
Calcium: 9.4 mg/dL (ref 8.4–10.5)
Chloride: 102 meq/L (ref 96–112)
Creatinine, Ser: 0.9 mg/dL (ref 0.40–1.20)
GFR: 66.52 mL/min (ref >60.00)
Glucose, Bld: 90 mg/dL (ref 70–99)
Potassium: 3.7 meq/L (ref 3.5–5.1)
Sodium: 140 meq/L (ref 135–145)
Total Bilirubin: 0.5 mg/dL (ref 0.2–1.2)
Total Protein: 7.9 g/dL (ref 6.0–8.3)

## 2024-05-12 LAB — FOLLICLE STIMULATING HORMONE: FSH: 47.3 m[IU]/mL

## 2024-05-12 LAB — LUTEINIZING HORMONE: LH: 28.27 m[IU]/mL

## 2024-05-12 MED ORDER — ROSUVASTATIN CALCIUM 20 MG PO TABS: 20.0000 mg | ORAL_TABLET | Freq: Every day | ORAL | 3 refills | Status: AC

## 2024-05-12 MED ORDER — TIRZEPATIDE-WEIGHT MANAGEMENT 2.5 MG/0.5ML ~~LOC~~ SOAJ: 2.5000 mg | SUBCUTANEOUS | 11 refills | Status: DC

## 2024-05-12 MED ORDER — HYDROCHLOROTHIAZIDE 12.5 MG PO CAPS: 12.5000 mg | ORAL_CAPSULE | Freq: Every day | ORAL | 0 refills | Status: AC

## 2024-05-12 MED ORDER — ZEPBOUND 15 MG/0.5ML ~~LOC~~ SOLN: 15.0000 mg | SUBCUTANEOUS | 1 refills | Status: DC

## 2024-05-12 NOTE — Progress Notes (Unsigned)
 ==============================  [facility]: [phone]   -- Medical Office Visit --  Patient: Nichole Wilkins      Age: 68 y.o.       Sex:  female  Date:   05/12/2024 Today's Healthcare Provider: Bernardino KANDICE Cone, MD  ==============================   Chief Complaint: Transitions Of Care (Patient states she needed to do this TOC so she can get her annual visit done. )  Discussed the use of AI scribe software for clinical note transcription with the patient, who gave verbal consent to proceed.  History of Present Illness Nichole Wilkins is a 68 year old female who presents with concerns about weight management and hormone imbalance.  She has been experiencing difficulty managing her weight, attributing it to potential hormonal imbalances. Despite dieting and attempts at exercise, she has not seen significant weight loss. She describes a pattern of 'yo-yo dieting' over the past eight years, particularly after menopause, and is concerned about her waistline and reduced mobility. She used to be able to run up four flights of stairs at work but now feels strain on her body.  She is concerned about the potential for developing diabetes. Her husband suspects she may have sleep apnea. She experiences drowsiness during the day.  She mentions a family history of ovarian cancer, with her sister having died from the disease at age 41. She has undergone genetic testing, which did not reveal any dormant genes for ovarian cancer. She has not had a hysterectomy or hormone treatments and is concerned about her hormone levels. She inquires about hormone testing to assess her current hormonal status.  Her current medications include hydrochlorothiazide  and rosuvastatin , which she is running low on and needs refills. She is still working and has not transitioned to [employer] due to favorable insurance coverage through her employer.  Wt Readings from Last 50  Encounters:  05/12/24 205 lb 12.8 oz (93.4 kg)  06/08/23 195 lb 9.6 oz (88.7 kg)  05/26/23 200 lb 6.4 oz (90.9 kg)  04/07/23 193 lb 12.8 oz (87.9 kg)  12/12/21 174 lb 3.2 oz (79 kg)  04/26/19 174 lb 6.4 oz (79.1 kg)  10/08/18 197 lb 1.5 oz (89.4 kg)  10/04/18 197 lb 3.2 oz (89.4 kg)  01/17/18 197 lb 3.2 oz (89.4 kg)  10/30/15 189 lb 11.2 oz (86 kg)  10/15/15 187 lb 12.8 oz (85.2 kg)  10/04/15 187 lb (84.8 kg)  08/28/15 194 lb 12.8 oz (88.4 kg)   BMI Readings from Last 50 Encounters:  05/12/24 32.23 kg/m  06/08/23 30.64 kg/m  05/26/23 31.39 kg/m  04/07/23 30.35 kg/m  12/12/21 27.28 kg/m  04/26/19 27.31 kg/m  10/08/18 31.81 kg/m  10/04/18 31.83 kg/m  01/17/18 31.83 kg/m  10/30/15 30.16 kg/m  10/15/15 29.86 kg/m  10/04/15 29.73 kg/m  08/28/15 30.97 kg/m   Lab Results  Component Value Date   HGBA1C 5.7 04/07/2023   HGBA1C 5.4 12/12/2021   HGBA1C 5.8 04/26/2019     Reduced mobility from arthritis Nonalcoholic Fatty Liver Disease (NAFLD).   {No specialty comments available.:1 Problem List as of 05/12/2024 Reviewed: 12/25/2021  9:31 AM by Gladis Elsie BROCKS, PA-C    Encounter for general adult medical examination with abnormal findings   Last Assessment & Plan 04/07/2023 Office Visit Written 04/07/2023  8:34 AM by Maribeth Camellia KANDICE, MD  Physical exam completed.  She will continue with healthy diet.  Encouraged to increase exercise to 3 times a week.  Prevnar 20  given today.  She will get Shingrix vaccine at the pharmacy per her request.  She will call to schedule her mammogram and bone density scan.  Patient declines flu vaccine, COVID-vaccine.  Lab work as outlined.      Family history of ovarian cancer   Last Assessment & Plan 08/28/2015 Office Visit Written 08/28/2015  9:25 AM by Maribeth Camellia MATSU, MD  Notes strong family history of gynecologic cancer. Also with an unknown type of cancer in her maternal grandmother. Given this we will refer to gynecology for  consideration of genetic testing for cancer risk.      Floaters in visual field, left   Last Assessment & Plan 06/08/2023 Office Visit Written 06/08/2023  8:51 AM by Maribeth Camellia MATSU, MD  Found to be floaters by ophthalmology.  Patient will monitor.      Hammer toe   Last Assessment & Plan 04/07/2023 Office Visit Written 04/07/2023  8:34 AM by Maribeth Camellia MATSU, MD  Noted bilateral second toes.  This is not bothering her to a significant degree.  Advised to monitor for now.  Discussed she could use padding to alleviate any discomfort from pressure on her toes.      Hyperlipidemia   Last Assessment & Plan 06/08/2023 Office Visit Written 06/08/2023  8:51 AM by Maribeth Camellia MATSU, MD  Chronic issue.  Patient will continue Crestor  20 mg daily.  We will check labs today.      Hypertension   Last Assessment & Plan 12/12/2021 Office Visit Written 12/12/2021  2:27 PM by Maribeth Camellia MATSU, MD  Adequately controlled.  She will continue HCTZ 12.5 mg daily.  We will check lab work.      Polyp of skin   Last Assessment & Plan 08/28/2015 Office Visit Written 08/28/2015  9:29 AM by Maribeth Camellia MATSU, MD  Polyp in left oral cavity. Has been evaluated by a dentist previously. Advised patient to return to dentist for possible removal and pathology.      Postmenopausal bleeding   Last Assessment & Plan 01/17/2018 Office Visit Written 01/17/2018  8:08 PM by Maribeth Camellia MATSU, MD  Possible polyp on exam which could be the cause of this though she needs evaluation with gynecology to determine if there is a more significant cause.  I discussed this with the patient.      Prediabetes   Last Assessment & Plan 12/12/2021 Office Visit Written 12/12/2021  2:27 PM by Maribeth Camellia MATSU, MD  Continue healthy diet and exercise.  Check A1c.      Toe pain   Last Assessment & Plan 04/26/2019 Office Visit Written 04/26/2019  2:23 PM by Maribeth Camellia MATSU, MD  Undetermined cause.  Discussed that this could represent a  nerve impingement issue anywhere from her toes up to her back or an orthopedic issue.  Given that it is fairly limited we will have the patient continue to monitor.  If she develops any new or worsening symptoms she will let us  know right away.     :1} {   Updated Problem List Entries: No problems updated.   (optional):1 } Background Reviewed: Problem List: has Hypertension; Family history of ovarian cancer; Polyp of skin; Hyperlipidemia; Encounter for general adult medical examination with abnormal findings; Postmenopausal bleeding; Toe pain; Prediabetes; Hammer toe; and Floaters in visual field, left on their problem list. Past Medical History:  has a past medical history of Allergic rhinitis, Chickenpox, Elevated blood pressure, High cholesterol, Hypertension, and Migraines. Past Surgical History:  has a past surgical history that includes Tubal ligation (1994); Breast enhancement surgery (2000); and Augmentation mammaplasty (Bilateral, 2000). Social History:   reports that she has never smoked. She has never used smokeless tobacco. She reports that she does not drink alcohol and does not use drugs. Family History:  family history includes Breast cancer in her maternal grandmother; Cancer - Other in her paternal grandfather; Cancer - Other (age of onset: 43) in her son; Cancer - Other (age of onset: 41) in her mother; Cervical cancer (age of onset: 92) in her sister; Heart attack (age of onset: 83) in her father; High blood pressure in an other family member; Ovarian cancer (age of onset: 13) in her sister; Stroke in an other family member. Allergies:  is allergic to amlodipine .   Medication Reconciliation: Current Outpatient Medications on File Prior to Visit  Medication Sig   hydrochlorothiazide  (MICROZIDE ) 12.5 MG capsule TAKE 1 CAPSULE BY MOUTH EVERY DAY   rosuvastatin  (CRESTOR ) 20 MG tablet Take 1 tablet (20 mg total) by mouth daily.   No current facility-administered medications on  file prior to visit.  There are no discontinued medications.   Physical Exam:    05/12/2024   11:05 AM 06/08/2023    8:38 AM 05/26/2023   11:08 AM  Vitals with BMI  Height 5' 7 5' 7 5' 7  Weight 205 lbs 13 oz 195 lbs 10 oz 200 lbs 6 oz  BMI 32.23 30.63 31.38  Systolic 136 122 869  Diastolic 82 74 78  Pulse 65 68 63  Vital signs reviewed.  Nursing notes reviewed. Weight trend reviewed. Physical Activity: Insufficiently Active (05/12/2024)   Exercise Vital Sign    Days of Exercise per Week: 1 day    Minutes of Exercise per Session: 40 min   General Appearance:  No acute distress appreciable.   Well-groomed, healthy-appearing female.  Well proportioned with no abnormal fat distribution.  Good muscle tone. Pulmonary:  Normal work of breathing at rest, no respiratory distress apparent. SpO2: 97 %  Musculoskeletal: All extremities are intact.  Neurological:  Awake, alert, oriented, and engaged.  No obvious focal neurological deficits or cognitive impairments.  Sensorium seems unclouded.   Speech is clear and coherent with logical content. Psychiatric:  Appropriate mood, pleasant and cooperative demeanor, thoughtful and engaged during the exam   Verbalized to patient: Physical Exam HEENT: Scarring present in both ears. Good dental alignment with crowns present. ABDOMEN: No palpable ovarian mass. Abdominal circumference 44 inches at the umbilicus. EXTREMITIES: No cyanosis or edema.   Results:   Verbalized to patient: Results LABS   Hemoglobin A1c: 5.7%     05/12/2024   11:03 AM 06/08/2023    8:46 AM 05/26/2023   11:09 AM 04/07/2023    8:18 AM  PHQ 2/9 Scores  PHQ - 2 Score 0 0 0 0  PHQ- 9 Score  0 0 3    { {Insert previous labs (optional):23779} {See past labs  Heme  Chem  Endocrine  Serology  Results Review (optional):1} No results found for any visits on 05/12/24.} Office Visit on 06/08/2023  Component Date Value Ref Range Status   Cholesterol 06/08/2023  142  0 - 200 mg/dL Final   Triglycerides 88/87/7975 74.0  0.0 - 149.0 mg/dL Final   HDL 88/87/7975 58.60  >39.00 mg/dL Final   VLDL 88/87/7975 14.8  0.0 - 40.0 mg/dL Final   LDL Cholesterol 06/08/2023 69  0 - 99 mg/dL Final   Total CHOL/HDL Ratio 06/08/2023  2   Final   NonHDL 06/08/2023 83.30   Final   Total Bilirubin 06/08/2023 0.6  0.2 - 1.2 mg/dL Final   Bilirubin, Direct 06/08/2023 0.1  0.0 - 0.3 mg/dL Final   Alkaline Phosphatase 06/08/2023 63  39 - 117 U/L Final   AST 06/08/2023 28  0 - 37 U/L Final   ALT 06/08/2023 22  0 - 35 U/L Final   Total Protein 06/08/2023 7.2  6.0 - 8.3 g/dL Final   Albumin 88/87/7975 4.4  3.5 - 5.2 g/dL Final  Office Visit on 04/07/2023  Component Date Value Ref Range Status   Sodium 04/07/2023 135  135 - 145 mEq/L Final   Potassium 04/07/2023 4.2  3.5 - 5.1 mEq/L Final   Chloride 04/07/2023 99  96 - 112 mEq/L Final   CO2 04/07/2023 29  19 - 32 mEq/L Final   Glucose, Bld 04/07/2023 78  70 - 99 mg/dL Final   BUN 90/88/7975 10  6 - 23 mg/dL Final   Creatinine, Ser 04/07/2023 0.94  0.40 - 1.20 mg/dL Final   Total Bilirubin 04/07/2023 0.7  0.2 - 1.2 mg/dL Final   Alkaline Phosphatase 04/07/2023 70  39 - 117 U/L Final   AST 04/07/2023 28  0 - 37 U/L Final   ALT 04/07/2023 21  0 - 35 U/L Final   Total Protein 04/07/2023 7.6  6.0 - 8.3 g/dL Final   Albumin 90/88/7975 4.2  3.5 - 5.2 g/dL Final   GFR 90/88/7975 63.62  >60.00 mL/min Final   Calcium  04/07/2023 9.9  8.4 - 10.5 mg/dL Final   Cholesterol 90/88/7975 240 (H)  0 - 200 mg/dL Final   Triglycerides 90/88/7975 121.0  0.0 - 149.0 mg/dL Final   HDL 90/88/7975 52.50  >39.00 mg/dL Final   VLDL 90/88/7975 24.2  0.0 - 40.0 mg/dL Final   LDL Cholesterol 04/07/2023 164 (H)  0 - 99 mg/dL Final   Total CHOL/HDL Ratio 04/07/2023 5   Final   NonHDL 04/07/2023 187.99   Final   Hgb A1c MFr Bld 04/07/2023 5.7  4.6 - 6.5 % Final   High risk HPV 04/07/2023 Negative   Final   Adequacy 04/07/2023 Satisfactory for  evaluation. The presence or absence of an   Final   Adequacy 04/07/2023 endocervical/transformation zone component cannot be determined because   Final   Adequacy 04/07/2023 of atrophy.   Final   Diagnosis 04/07/2023 - Negative for intraepithelial lesion or malignancy (NILM)   Final   Comment 04/07/2023 Normal Reference Range HPV - Negative   Final  No image results found. No results found.       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 I gave her caloric and protein daily intake values and encouraged increasing fiber and water intake. I discussed weight loss medications that could be used with consideration of her specific past medical history and financial limitations. I explained to her  the 3 things that almost all people who lose weight and keep it off do: 1.  Cut out all sugary beverages and all calorie-containing beverages  2.  Resistance training (cardiac exercise not as important for maintaining muscle mass) 3.  Actually counting calories and carbs and deciding against foods that have high amounts - I.e. Check nutrition labels.  Primary hypertension  Suspected sleep apnea Encouraged get apple watch Family history  of ovarian cancer  Encounter for general adult medical examination with abnormal findings  Screening for malignant neoplasm of colon  Hyperlipidemia, unspecified hyperlipidemia type     {Assessment and Plan Assessment & Plan Obesity with prediabetes and suspected sleep apnea   Challenges of weight loss due to set point theory were discussed. The role of GLP-1 agonists like tirzepatide (Zepbound) in managing weight was explored, highlighting benefits in reducing appetite, managing weight, and potentially lowering risks of heart attacks, strokes, and fatty liver disease. Insurance challenges in obtaining  medication were addressed, with the potential need for a sleep apnea diagnosis to aid in coverage. Alternative options for obtaining medication if insurance denies coverage were considered. Order Zepbound (tirzepatide). Upgrade Apple Watch to model 9 or better to monitor for sleep apnea. Submit prior authorization for Zepbound. Consider purchasing Zepbound directly from [employer] if insurance denies coverage. Provide instructions for using Zepbound vials and syringes. Discuss dietary modifications to avoid sugary drinks and manage carbohydrate intake.  Postmenopausal state with family history of ovarian cancer   Concerns about hormone levels and potential ovarian cancer risk were discussed, given her postmenopausal state and family history of ovarian cancer, with her sister diagnosed at age 54. Genetic screening for ovarian cancer was negative. Insurance limitations on ovarian cancer screening tests were addressed. Order hormone panel including estradiol, FSH, LH, and prolactin. Consider transvaginal ultrasound if she opts for out-of-pocket payment. Discuss the option of full body MRI for ovarian cancer screening if financially feasible.  Adult Wellness Visit   Routine wellness visit conducted. General health maintenance was discussed, emphasizing a diet rich in avocado, fish, extra virgin olive oil, and coffee, while limiting processed foods and trans fats. The importance of regular screenings and vaccinations was reviewed. Order mammogram with 3D tomosynthesis using implant displaced views. Order colonoscopy. Perform routine blood work for annual physical. Provide handouts on diet and exercise.  Recording duration: 57 minutes   }  ORDER ASSOCIATIONS  #   DIAGNOSIS / CONDITION ICD-10 ENCOUNTER ORDER     ICD-10-CM   1. Primary hypertension  I10            Orders Placed in Encounter:   Lab Orders  No laboratory test(s) ordered today   Imaging Orders  No imaging studies ordered today    Referral Orders  No referral(s) requested today   No orders of the defined types were placed in this encounter.   No orders of the defined types were placed in this encounter. ED Discharge Orders          Ordered    Ambulatory referral to Gastroenterology        Pending    hydrochlorothiazide  (MICROZIDE ) 12.5 MG capsule  Daily        Pending    rosuvastatin  (CRESTOR ) 20 MG tablet  Daily        Pending              This document was synthesized by artificial intelligence ([employer]) using HIPAA-compliant recording of the clinical interaction;   We discussed the use of AI scribe software for clinical note transcription with the patient, who gave verbal consent to proceed. additional Info: This encounter employed state-of-the-art, real-time, collaborative documentation. The patient actively reviewed and assisted in updating their electronic medical record on a shared screen, ensuring transparency and facilitating joint problem-solving for the problem list, overview, and plan. This approach promotes accurate, informed care. The treatment plan was discussed and reviewed in detail, including medication safety, potential  side effects, and all patient questions. We confirmed understanding and comfort with the plan. Follow-up instructions were established, including contacting the office for any concerns, returning if symptoms worsen, persist, or new symptoms develop, and precautions for potential emergency department visits.    Hormone Panel Medical Necessity  The medical necessity for ordering serum prolactin, luteinizing hormone (LH), follicle-stimulating hormone (FSH), and estradiol tests is based on the need to evaluate endocrine function and menopausal status in a postmenopausal woman with a family history of ovarian cancer and negative genetic screening.  Current guidelines from the [employer] recommend laboratory assessment of estradiol, FSH, LH, and  prolactin as clinically indicated for survivors or individuals with symptoms of hormonal disruption, abnormal bleeding, or suspected endocrine dysfunction. These tests are essential for distinguishing between primary ovarian insufficiency, hypogonadotropic hypogonadism, and other causes of amenorrhea or menopausal symptoms, and for ruling out secondary causes such as hyperprolactinemia or pituitary dysfunction.[1][2][3][4][5]  - FSH and estradiol are required to confirm menopausal status and exclude primary ovarian insufficiency or other ovarian dysfunction.[3][6][5]  - LH is necessary to assess pituitary-gonadal axis function and differentiate between central and peripheral causes of hypogonadism.[4][5]  - Prolactin is indicated to rule out hyperprolactinemia, which can cause amenorrhea and mimic menopausal symptoms.[3][4][5]  In postmenopausal women, these tests are medically necessary to evaluate persistent or atypical symptoms, guide management of hormone-related complaints, and exclude secondary causes of endocrine dysfunction. This approach is supported by consensus guidelines and expert recommendations for the assessment of hormonal symptoms and reproductive health in women with relevant family history or clinical risk factors.[1][2][3][4][5] References Survivorship. Occupational hygienist. Updated 2023-12-17. Primary Ovarian Insufficiency. Stuenkel CA, Gompel A. The [place] Journal of Medicine. 2023;388(2):154-163. doi:10.1056/NEJMcp2116488. Primary Ovarian Insufficiency in Adolescents and Young Women. [employer] of [employer]stetricians and Gynecologists [phone]). Functional Hypothalamic Amenorrhea: An Endocrine Society Clinical Practice Guideline. Vaughn CM, Ackerman KE, Berga SL, et al. The Journal of Clinical Endocrinology and Metabolism. 2017;102(5):1413-1439. doi:10.1210/jc.7982-99868. Infertility Evaluation and Treatment. Santoro N, Polotsky AJ. The [place] Journal of  Medicine. 2025;392(11):1111-1119. doi:10.1056/NEJMcp2311150. Primary Ovarian Insufficiency. Nelson LM. The [place] Journal of Medicine. 2009;360(6):606-14. doi:10.1056/NEJMcp0808697.

## 2024-05-12 NOTE — Assessment & Plan Note (Signed)
We had a conversation today about healthy eating habits, exercise, calorie and carb goals for sustainable and successful weight loss. I gave her caloric and protein daily intake values and encouraged increasing fiber and water intake. I discussed weight loss medications that could be used with consideration of her specific past medical history and financial limitations.  I explained to her  the 3 things that almost all people who lose weight and keep it off do: 1.  Cut out all sugary beverages and all calorie-containing beverages  2.  Resistance training (cardiac exercise not as important for maintaining muscle mass) 3.  Actually counting calories and carbs and deciding against foods that have high amounts - I.e. Check nutrition labels.

## 2024-05-12 NOTE — Assessment & Plan Note (Signed)
 Encouraged get apple watch

## 2024-05-12 NOTE — Patient Instructions (Addendum)
 It was a pleasure seeing you today! Your health and satisfaction are our top priorities.  Bernardino Cone, MD  VISIT SUMMARY: Today, you visited to discuss concerns about weight management, hormone imbalance, and general health maintenance. We reviewed your current health status, medications, and family history, and developed a plan to address your concerns.  YOUR PLAN: -OBESITY WITH PREDIABETES AND SUSPECTED SLEEP APNEA: Obesity is a condition where excess body fat negatively affects your health. Prediabetes means your blood sugar levels are higher than normal but not yet high enough to be diagnosed as diabetes. We discussed the challenges of weight loss and the potential benefits of using a medication called tirzepatide (Zepbound) to help manage your weight. This medication can reduce appetite and lower the risk of heart attacks, strokes, and fatty liver disease. We will submit a prior authorization for Zepbound and consider alternative options if insurance denies coverage. You should upgrade your Apple Watch to model 9 or better to monitor for sleep apnea. Additionally, we discussed dietary changes to avoid sugary drinks and manage carbohydrate intake.  -POSTMENOPAUSAL STATE WITH FAMILY HISTORY OF OVARIAN CANCER: Being postmenopausal means you have stopped having menstrual periods, which can affect your hormone levels. Given your family history of ovarian cancer, we discussed your concerns about hormone levels and cancer risk. Your genetic screening for ovarian cancer was negative. We will order a hormone panel to check your hormone levels and consider a transvaginal ultrasound if you choose to pay out-of-pocket. A full body MRI for ovarian cancer screening is also an option if it is financially feasible.  -ADULT WELLNESS VISIT: This visit was part of your routine health check-up. We discussed general health maintenance, including a diet rich in avocado, fish, extra virgin olive oil, and coffee, while  limiting processed foods and trans fats. We also reviewed the importance of regular screenings and vaccinations. We will order a mammogram with 3D tomosynthesis, a colonoscopy, and routine blood work for your annual physical. You will receive handouts on diet and exercise.  INSTRUCTIONS: We will submit a prior authorization for Zepbound. Please upgrade your Apple Watch to model 9 or better to monitor for sleep apnea. Follow the dietary recommendations discussed. We will order a hormone panel, and you may consider a transvaginal ultrasound or full body MRI if financially feasible. Routine blood work, a mammogram with 3D tomosynthesis, and a colonoscopy will be scheduled. Please follow up as needed.  Your Providers PCP: Cone Bernardino MATSU, MD,  [phone])  NEXT STEPS: [x]  Early Intervention: Schedule sooner appointment, call our on-call services, or go to emergency room if there is any significant Increase in pain or discomfort New or worsening symptoms Sudden or severe changes in your health [x]  Flexible Follow-Up: We recommend a No follow-ups on file. for optimal routine care. This allows for progress monitoring and treatment adjustments. [x]  Preventive Care: Schedule your annual preventive care visit! It's typically covered by insurance and helps identify potential health issues early. [x]  Lab & X-ray Appointments: Incomplete tests scheduled today, or call to schedule. X-rays: [facility] (M-F, 8:30am-noon or 1pm-5pm). [x]  Medical Information Release: Sign a release form at front desk to obtain relevant medical information we don't have.  MAKING THE MOST OF OUR FOCUSED 20 MINUTE APPOINTMENTS: [x]   Clearly state your top concerns at the beginning of the visit to focus our discussion [x]   If you anticipate you will need more time, please inform the front desk during scheduling - we can book multiple appointments in the  same week. [x]   If you have transportation problems- use  our convenient video appointments or ask about transportation support. [x]   We can get down to business faster if you use MyChart to update information before the visit and submit non-urgent questions before your visit. Thank you for taking the time to provide details through MyChart.  Let our nurse know and she can import this information into your encounter documents.  Arrival and Wait Times: [x]   Arriving on time ensures that everyone receives prompt attention. [x]   Early morning (8a) and afternoon (1p) appointments tend to have shortest wait times. [x]   Unfortunately, we cannot delay appointments for late arrivals or hold slots during phone calls.  Getting Answers and Following Up [x]   Simple Questions & Concerns: For quick questions or basic follow-up after your visit, reach us  at [phone] or MyChart messaging. [x]   Complex Concerns: If your concern is more complex, scheduling an appointment might be best. Discuss this with the staff to find the most suitable option. [x]   Lab & Imaging Results: We'll contact you directly if results are abnormal or you don't use MyChart. Most normal results will be on MyChart within 2-3 business days, with a review message from Dr. Jesus. Haven't heard back in 2 weeks? Need results sooner? Contact us  at [phone]. [x]   Referrals: Our referral coordinator will manage specialist referrals. The specialist's office should contact you within 2 weeks to schedule an appointment. Call us  if you haven't heard from them after 2 weeks.  Staying Connected [x]   MyChart: Activate your MyChart for the fastest way to access results and message us . See the last page of this paperwork for instructions on how to activate.  Bring to Your Next Appointment [x]   Medications: Please bring all your medication bottles to your next appointment to ensure we have an accurate record of your prescriptions. [x]   Health Diaries: If you're monitoring any health conditions at  home, keeping a diary of your readings can be very helpful for discussions at your next appointment.  Billing [x]   X-ray & Lab Orders: These are billed by separate companies. Contact the invoicing company directly for questions or concerns. [x]   Visit Charges: Discuss any billing inquiries with our administrative services team.  Your Satisfaction Matters [x]   Share Your Experience: We strive for your satisfaction! If you have any complaints, or preferably compliments, please let Dr. Jesus know directly or contact our Practice Administrators, Manuelita Rubin or [employer], by asking at the front desk.   Reviewing Your Records [x]   Review this early draft of your clinical encounter notes below and the final encounter summary tomorrow on MyChart after its been completed.  All orders placed so far are visible here: Obesity due to energy imbalance Assessment & Plan: Encouraged get apple watch for obstructive sleep apnea detection  We had a conversation today about healthy eating habits, exercise, calorie and carb goals for sustainable and successful weight loss. I gave her caloric and protein daily intake values and encouraged increasing fiber and water intake. I discussed weight loss medications that could be used with consideration of her specific past medical history and financial limitations. I explained to her  the 3 things that almost all people who lose weight and keep it off do: 1.  Cut out all sugary beverages and all calorie-containing beverages  2.  Resistance training (cardiac exercise not as important for maintaining muscle mass) 3.  Actually counting calories and carbs and deciding against foods that have  high amounts - I.e. Check nutrition labels.   Obesity with prediabetes and suspected sleep apnea   Challenges of weight loss due to set point theory were discussed. The role of GLP-1 agonists like tirzepatide (Zepbound) in managing weight was explored, highlighting benefits in  reducing appetite, managing weight, and potentially lowering risks of heart attacks, strokes, and fatty liver disease. Insurance challenges in obtaining medication were addressed, with the potential need for a sleep apnea diagnosis to aid in coverage. Alternative options for obtaining medication if insurance denies coverage were considered. Order Zepbound (tirzepatide). Upgrade Apple Watch to model 9 or better to monitor for sleep apnea. Submit prior authorization for Zepbound. Consider purchasing Zepbound directly from [facility] if insurance denies coverage. Provide instructions for using Zepbound vials and syringes. Discuss dietary modifications to avoid sugary drinks and manage carbohydrate intake.    Orders: -     Tirzepatide-Weight Management; Inject 2.5 mg into the skin once a week.  Dispense: 2 mL; Refill: 11 -     Follicle stimulating hormone -     Luteinizing hormone -     Prolactin -     Zepbound; Inject 15 mg into the skin once a week.  Dispense: 2 mL; Refill: 1  Suspected sleep apnea Assessment & Plan: Encouraged get apple watch for obstructive sleep apnea detection  We had a conversation today about healthy eating habits, exercise, calorie and carb goals for sustainable and successful weight loss. I gave her caloric and protein daily intake values and encouraged increasing fiber and water intake. I discussed weight loss medications that could be used with consideration of her specific past medical history and financial limitations. I explained to her  the 3 things that almost all people who lose weight and keep it off do: 1.  Cut out all sugary beverages and all calorie-containing beverages  2.  Resistance training (cardiac exercise not as important for maintaining muscle mass) 3.  Actually counting calories and carbs and deciding against foods that have high amounts - I.e. Check nutrition labels.   Obesity with prediabetes and suspected sleep apnea   Challenges of weight loss due to  set point theory were discussed. The role of GLP-1 agonists like tirzepatide (Zepbound) in managing weight was explored, highlighting benefits in reducing appetite, managing weight, and potentially lowering risks of heart attacks, strokes, and fatty liver disease. Insurance challenges in obtaining medication were addressed, with the potential need for a sleep apnea diagnosis to aid in coverage. Alternative options for obtaining medication if insurance denies coverage were considered. Order Zepbound (tirzepatide). Upgrade Apple Watch to model 9 or better to monitor for sleep apnea. Submit prior authorization for Zepbound. Consider purchasing Zepbound directly from [facility] if insurance denies coverage. Provide instructions for using Zepbound vials and syringes. Discuss dietary modifications to avoid sugary drinks and manage carbohydrate intake.    Orders: -     Tirzepatide-Weight Management; Inject 2.5 mg into the skin once a week.  Dispense: 2 mL; Refill: 11  Primary hypertension Assessment & Plan: Borderline controlled Refilled current medication(s)   Orders: -     hydroCHLOROthiazide ; Take 1 capsule (12.5 mg total) by mouth daily.  Dispense: 90 capsule; Refill: 0  Family history of ovarian cancer Assessment & Plan: Postmenopausal state with family history of ovarian cancer   Concerns about hormone levels and potential ovarian cancer risk were discussed, given her postmenopausal state and family history of ovarian cancer, with her sister diagnosed at age 7. Genetic screening for ovarian cancer was negative.  Insurance limitations on ovarian cancer screening tests were addressed. Order hormone panel including estradiol, FSH, LH, and prolactin. Consider transvaginal ultrasound if she opts for out-of-pocket payment. Discuss the option of full body MRI for ovarian cancer screening if financially feasible.  Orders: -     Estradiol -     Follicle stimulating hormone -     Luteinizing hormone -      Prolactin -     MM 3D DIAGNOSTIC MAMMOGRAM BILATERAL BREAST W/IMPLANT; Future  Encounter for annual general medical examination with abnormal findings in adult  Screening for malignant neoplasm of colon -     Ambulatory referral to Gastroenterology -     MM 3D DIAGNOSTIC MAMMOGRAM BILATERAL BREAST W/IMPLANT; Future  Encounter for general adult medical examination with abnormal findings Assessment & Plan: Routine wellness visit conducted. General health maintenance was discussed, emphasizing a diet rich in avocado, fish, extra virgin olive oil, and coffee, while limiting processed foods and trans fats. The importance of regular screenings and vaccinations was reviewed. Order mammogram with 3D tomosynthesis using implant displaced views. Order colonoscopy. Perform routine blood work for annual physical. Provide handouts on diet and exercise.  Orders: -     Lipid panel -     Comprehensive metabolic panel with GFR -     CBC with Differential/Platelet -     TSH Rfx on Abnormal to Free T4  Hyperlipidemia, unspecified hyperlipidemia type Assessment & Plan: Shared decision-making done; patient understood rationale and agreed to labwork   Orders: -     Rosuvastatin  Calcium ; Take 1 tablet (20 mg total) by mouth daily.  Dispense: 90 tablet; Refill: 3      Go to [facility] [employer] to purchase the medicine Zepbound 15 mg vials.  Go to [employer] for the syringes. How to draw and inject Use a 0.5 mL syringe with a 31-gauge needle for accuracy and comfort. Draw up 0.08 mL (or 8 units on an insulin syringe if calibrated in units of 0.01 mL) from the diluted vial. Inject as directed by your healthcare provider.  Important Notes: Do not inject directly from the vial without dilution because the volume is too small to measure safely. Use sterile technique and a new syringe for each injection. Dispose of needles properly. Contact your healthcare provider if you have any questions or concerns.    Comprehensive Physical Exam (CPE) preventive care annual visit was performed today: HEALTH MAINTENANCE COUNSELING AND ANTICIPATORY GUIDANCE  Reviewed the following verbally with patient -provided AVS materials: Preventive Measure Recommendation  Eye Exams Every 1-2 years  Dental Care Cleanings every 6 months or more, brush/floss 3x daily  Sinus Care Saline spray rinses daily encouraged   Sleep 8 hours nightly, good sleep hygiene, e-monitoring if any daytime drowsiness  Diet Fruits/vegetables/fiber/healthy fats, balance and moderation  Exercise 150 minutes weekly  Risk Behaviors Discouraged any/all high risk behaviors   Bone Health Recommend to maintain a good source of calcium  and vitamin D in diet.  She does NOT have any personal history of early osteoporosis or fragility fractures []   For Bone Density [facility]  [address] [place], [place]  [phone]  Anticipatory Counseling Recommend absolute abstinence from all vaped or smoked recreational or illicit substances of abuse such as tobacco, nicotine, alcohol, illicit drugs, even sugar.   Safe driving / no text and driving is recommended  Health Maintenance Health Maintenance Due  Topic Date Due   DEXA SCAN  Never done   Colonoscopy  12/14/2023   COVID-19 Vaccine (1 - 2025-26 season) Never done   Health Maintenance  Topic Date Due   DEXA SCAN  Never done   Colonoscopy  12/14/2023   COVID-19 Vaccine (1 - 2025-26 season) Never done   Influenza Vaccine  10/24/2024 (Originally 02/25/2024)   Mammogram  05/13/2025   DTaP/Tdap/Td (2 - Tdap) 04/25/2029   Pneumococcal Vaccine: 50+ Years  Completed   Hepatitis C Screening  Completed   Zoster Vaccines- Shingrix  Completed   Meningococcal B Vaccine  Aged Out  Reviewed/Encouraged completion.  Immunizations Immunization History  Administered Date(s) Administered   Influenza Inj Mdck Quad Pf 05/09/2019   Influenza-Unspecified 05/27/2015, 05/09/2019   PNEUMOCOCCAL  CONJUGATE-20 04/07/2023   Td 04/26/2019   Zoster Recombinant(Shingrix) 05/11/2023, 08/05/2023  Reviewed.   CANCER SCREENING SHARED DECISION MAKING   Colon HM Colonoscopy          Current Care Gaps     Colonoscopy (Every 10 Years) Overdue since 12/14/2023    12/13/2013  Done   Only the first 1 history entries have been loaded, but more history exists.              .  Health Maintenance  Topic Date Due   DEXA SCAN  Never done   Colonoscopy  12/14/2023   COVID-19 Vaccine (1 - 2025-26 season) Never done   Influenza Vaccine  10/24/2024 (Originally 02/25/2024)   Mammogram  05/13/2025   DTaP/Tdap/Td (2 - Tdap) 04/25/2029   Pneumococcal Vaccine: 50+ Years  Completed   Hepatitis C Screening  Completed   Zoster Vaccines- Shingrix  Completed   Meningococcal B Vaccine  Aged Out   Inquired about any gastrointestinal bleeding/family history   Lung Current guidelines recommend individuals aged 52 to 27 who currently smoke or formerly smoked and have a >= 20 pack-year smoking history should undergo annual screening with low-dose computed tomography (LDCT). Tobacco Use: Low Risk  (06/08/2023)   Patient History    Smoking Tobacco Use: Never    Smokeless Tobacco Use: Never    Passive Exposure: Not on file    Skin Advise regular sunscreen use. Patient denies worrisome, changing, or new skin lesions.  Gynecological History of breast cancer in Maternal Grandmother. Result Date Procedure Results Follow-ups  04/07/2023 Cytology - PAP( [place]) High risk HPV: Negative Adequacy: Satisfactory for evaluation. The presence or absence of an Adequacy: endocervical/transformation zone component cannot be determined because Adequacy: of atrophy. Diagnosis: - Negative for intraepithelial lesion or malignancy (NILM) Comment: Normal Reference Range HPV - Negative    HM Mammogram          Awaiting Completion     Mammogram (Every 2 Years) Order placed this encounter    05/12/2024  Order  placed for MM 3D DIAGNOSTIC MAMMOGRAM BILATERAL BREAST W/IMPLANT by Jesus Bernardino MATSU, MD    Only the first 1 history entries have been loaded, but more history exists.               MM 3D DIAGNOSTIC MAMMOGRAM BILATERAL BREAST W/IMPLANT Result Date: 05/14/2023 CLINICAL DATA:  68 year old female presenting for delayed follow-up of a likely benign left breast asymmetry. The patient is aware that both implants are ruptured. EXAM: DIGITAL DIAGNOSTIC BILATERAL MAMMOGRAM WITH IMPLANTS, TOMOSYNTHESIS AND CAD TECHNIQUE: Bilateral digital diagnostic mammography and breast tomosynthesis was performed. Standard and/or implant displaced views were performed. The images were evaluated with computer-aided detection. COMPARISON:  Previous exam(s). ACR Breast Density Category c: The breasts are heterogeneously dense, which may obscure small  masses. FINDINGS: No suspicious calcifications, masses or areas of distortion are seen in the bilateral breasts. The asymmetry in the inferior posterior left breast is no longer visualized. The patient has ruptured bilateral retropectoral implants. IMPRESSION: 1. Interval resolution of the asymmetry in the inferior left breast. 2.  No evidence of malignancy in the bilateral breasts. RECOMMENDATION: Screening mammogram in one year.(Code:[id number]-B-01Y) I have discussed the findings and recommendations with the patient. If applicable, a reminder letter will be sent to the patient regarding the next appointment. BI-RADS CATEGORY  1: Negative. Electronically Signed   By: Rosaline Collet M.D.   On: 05/14/2023 09:51   MM DIAG BREAST TOMO UNI LEFT Result Date: 09/22/2019 CLINICAL DATA:  Short-term interval follow-up of probable changes to the left breast from implant rupture. EXAM: DIGITAL DIAGNOSTIC LEFT MAMMOGRAM WITH CAD AND TOMO ULTRASOUND LEFT BREAST COMPARISON:  Previous exam(s). ACR Breast Density Category c: The breast tissue is heterogeneously dense, which may obscure small masses.  FINDINGS: The left breast implant is deflated. The asymmetry in the inferior aspect of the left breast on the lateral views is stable. No suspicious mass or malignant type microcalcifications identified. Mammographic images were processed with CAD. Targeted ultrasound is performed, showing there is a fluid collection in the left breast at 6 o'clock 5 cm from the nipple. IMPRESSION: Changes in the left breast are thought to be secondary to implant rupture. RECOMMENDATION: Short-term interval follow-up left mammogram and ultrasound 6 months is recommended. I have discussed the findings and recommendations with the patient. If applicable, a reminder letter will be sent to the patient regarding the next appointment. BI-RADS CATEGORY  3: Probably benign. Electronically Signed   By: Dina  Arceo M.D.   On: 09/22/2019 14:10   MM DIAG BREAST W/IMPLANT TOMO BILATERAL Addendum Date: 08/09/2019 ADDENDUM REPORT: 08/09/2019 15:18 ADDENDUM: Addendum for addition of timeframe to the recommendation: Recommend left breast diagnostic mammogram in 4-6 weeks to reassess. Electronically Signed   By: Bard Moats M.D.   On: 08/09/2019 15:18   Result Date: 08/09/2019 CLINICAL DATA:  Patient presents for evaluation of possible left implant rupture. EXAM: DIGITAL DIAGNOSTIC BILATERAL MAMMOGRAM WITH CAD AND TOMO ULTRASOUND LEFT BREAST COMPARISON:  Previous exam(s). ACR Breast Density Category c: The breast tissue is heterogeneously dense, which may obscure small masses. FINDINGS: Standard and implant displaced views of the right left breast were obtained. The left saline implant is collapsed and has ruptured. On the nondisplaced left MLO and true lateral views there is a focal asymmetry within the inferior aspect of the left breast which partially effaces with spot compression MLO tomosynthesis imaging. No additional masses, calcifications or distortion identified within either breast. Mammographic images were processed with CAD.  Ultrasound is performed, showing no discrete solid mass within the lower outer left breast 3-7 o'clock position. Small amount of fluid is demonstrated within the retropectoral left chest wall adjacent to the ruptured implant most pronounced at the 5 o'clock position. Note the left implant is ruptured. IMPRESSION: Findings compatible with left retropectoral saline implant rupture. There is a small amount of fluid within the retropectoral space most compatible with recent rupture. Indeterminate asymmetry within the inferior left breast on the nondisplaced views favored to represent edema/acute change from ruptured left implant. RECOMMENDATION: Left breast diagnostic mammogram to reassess the asymmetry within the inferior left breast on the MLO and true lateral views which is favored to represent acute change from implant rupture. Recommend follow-up until resolution. I have discussed the findings and recommendations with the  patient. If applicable, a reminder letter will be sent to the patient regarding the next appointment. BI-RADS CATEGORY  3: Probably benign. Electronically Signed: By: Bard Moats M.D. On: 08/09/2019 11:16   Cervical, breast, uterine, and ovarian cancer screenings require a dedicated well-woman exam with a gynecology specialist which were not included in today's annual primary care preventive visit.  Safe sex is encouraged, but routine urinalysis sexual transmitted infection screenings in asymptomatic patients are not recommended per guidelines.  Patient verbally acknowledged understanding and intent to maintain routine gynecology appointment(s).    We assisted with ordering special mammogram for her ruptured implants.     Thyroid  Palpated thyroid  -no nodules of concern(s).

## 2024-05-13 ENCOUNTER — Ambulatory Visit: Payer: Self-pay | Admitting: Internal Medicine

## 2024-05-13 ENCOUNTER — Encounter: Payer: Self-pay | Admitting: Internal Medicine

## 2024-05-13 LAB — TSH RFX ON ABNORMAL TO FREE T4: TSH: 1.61 u[IU]/mL (ref 0.450–4.500)

## 2024-05-13 LAB — ESTRADIOL: Estradiol: <30 pg/mL

## 2024-05-13 LAB — PROLACTIN: Prolactin: 4.2 ng/mL

## 2024-05-13 NOTE — Assessment & Plan Note (Signed)
 Postmenopausal state with family history of ovarian cancer   Concerns about hormone levels and potential ovarian cancer risk were discussed, given her postmenopausal state and family history of ovarian cancer, with her sister diagnosed at age 66. Genetic screening for ovarian cancer was negative. Insurance limitations on ovarian cancer screening tests were addressed. Order hormone panel including estradiol, FSH, LH, and prolactin. Consider transvaginal ultrasound if she opts for out-of-pocket payment. Discuss the option of full body MRI for ovarian cancer screening if financially feasible.

## 2024-05-13 NOTE — Assessment & Plan Note (Signed)
 Borderline controlled Refilled current medication(s)

## 2024-05-13 NOTE — Assessment & Plan Note (Signed)
 Routine wellness visit conducted. General health maintenance was discussed, emphasizing a diet rich in avocado, fish, extra virgin olive oil, and coffee, while limiting processed foods and trans fats. The importance of regular screenings and vaccinations was reviewed. Order mammogram with 3D tomosynthesis using implant displaced views. Order colonoscopy. Perform routine blood work for annual physical. Provide handouts on diet and exercise.

## 2024-05-13 NOTE — Assessment & Plan Note (Signed)
 Shared decision-making done; patient understood rationale and agreed to [facility]

## 2024-05-15 ENCOUNTER — Telehealth: Payer: Self-pay

## 2024-05-15 ENCOUNTER — Other Ambulatory Visit (HOSPITAL_COMMUNITY): Payer: Self-pay

## 2024-05-15 NOTE — Telephone Encounter (Signed)
 Pharmacy Patient Advocate Encounter   Received notification from Onbase that prior authorization for Zepbound 2.5MG /0.5ML pen-injectors is required/requested.   Insurance verification completed.   The patient is insured through [facility].   Per test claim:  +MUST USE ORLISTAT, QSYMIA, SAXENDA, WEGOVY  is preferred by the insurance.  If suggested medication is appropriate, Please send in a new RX and discontinue this one. If not, please advise as to why it's not appropriate so that we may request a Prior Authorization. Please note, some preferred medications may still require a PA.  If the suggested medications have not been trialed and there are no contraindications to their use, the PA will not be submitted, as it will not be approved.

## 2024-05-16 ENCOUNTER — Other Ambulatory Visit: Payer: Self-pay | Admitting: Internal Medicine

## 2024-05-16 DIAGNOSIS — Z1211 Encounter for screening for malignant neoplasm of colon: Secondary | ICD-10-CM

## 2024-05-16 DIAGNOSIS — I10 Essential (primary) hypertension: Secondary | ICD-10-CM

## 2024-05-16 DIAGNOSIS — Z0001 Encounter for general adult medical examination with abnormal findings: Secondary | ICD-10-CM

## 2024-05-16 DIAGNOSIS — Z1231 Encounter for screening mammogram for malignant neoplasm of breast: Secondary | ICD-10-CM

## 2024-05-16 DIAGNOSIS — E785 Hyperlipidemia, unspecified: Secondary | ICD-10-CM

## 2024-05-16 DIAGNOSIS — Z8041 Family history of malignant neoplasm of ovary: Secondary | ICD-10-CM

## 2024-05-16 DIAGNOSIS — E669 Obesity, unspecified: Secondary | ICD-10-CM

## 2024-05-16 DIAGNOSIS — R29818 Other symptoms and signs involving the nervous system: Secondary | ICD-10-CM

## 2024-05-18 ENCOUNTER — Other Ambulatory Visit (HOSPITAL_COMMUNITY): Payer: Self-pay

## 2024-05-18 ENCOUNTER — Telehealth: Payer: Self-pay

## 2024-05-18 DIAGNOSIS — E669 Obesity, unspecified: Secondary | ICD-10-CM

## 2024-05-18 NOTE — Telephone Encounter (Signed)
 Pharmacy Patient Advocate Encounter   Received notification from RX Request Messages that prior authorization for Wegovy 0.25MG /0.5ML auto-injectors is required/requested.   Insurance verification completed.   The patient is insured through [facility].   Per test claim: PA required; PA submitted to above mentioned insurance via Latent Key/confirmation #/EOC Tzhncb 0.25MG /0.5ML auto-injectors Status is pending

## 2024-05-19 ENCOUNTER — Encounter: Admitting: Nurse Practitioner

## 2024-05-19 NOTE — Telephone Encounter (Signed)
 Pharmacy Patient Advocate Encounter  Received notification from [facility] that Prior Authorization for [facility] auto-injectors has been DENIED.  Full denial letter will be uploaded to the media tab. See denial reason below.   PA #/Case ID/Reference #: [id number]

## 2024-05-25 MED ORDER — ZEPBOUND 15 MG/0.5ML ~~LOC~~ SOLN: 15.0000 mg | SUBCUTANEOUS | 11 refills | Status: AC

## 2024-05-25 NOTE — Addendum Note (Signed)
 Addended by: Daijha Leggio G on: 05/25/2024 05:49 PM   Modules accepted: Orders

## 2024-06-07 ENCOUNTER — Ambulatory Visit
Admission: RE | Admit: 2024-06-07 | Discharge: 2024-06-07 | Disposition: A | Discharge status: Home / Self Care | Source: Ambulatory Visit | Attending: Internal Medicine | Admitting: Internal Medicine

## 2024-06-07 DIAGNOSIS — Z1231 Encounter for screening mammogram for malignant neoplasm of breast: Secondary | ICD-10-CM

## 2024-06-07 DIAGNOSIS — Z1211 Encounter for screening for malignant neoplasm of colon: Secondary | ICD-10-CM

## 2024-06-07 DIAGNOSIS — Z8041 Family history of malignant neoplasm of ovary: Secondary | ICD-10-CM

## 2024-06-11 ENCOUNTER — Ambulatory Visit: Payer: Self-pay | Admitting: Internal Medicine

## 2024-06-11 NOTE — Progress Notes (Signed)
 Please confirm patient has received orders/completed the requested follow up screening (diagnostic bilateral mammogram with reflex ultrasounds for asymmetry)

## 2024-06-12 ENCOUNTER — Other Ambulatory Visit: Payer: Self-pay | Admitting: Internal Medicine

## 2024-06-12 ENCOUNTER — Encounter: Payer: Self-pay | Admitting: Internal Medicine

## 2024-06-12 DIAGNOSIS — R928 Other abnormal and inconclusive findings on diagnostic imaging of breast: Secondary | ICD-10-CM

## 2024-06-12 NOTE — Progress Notes (Signed)
 Tried to call pt via phone to see if pt has had the scan done. Pt did not answer left message to review via my chart or call the office back.

## 2024-06-13 ENCOUNTER — Encounter

## 2024-06-13 ENCOUNTER — Other Ambulatory Visit

## 2024-06-13 ENCOUNTER — Inpatient Hospital Stay: Admission: RE | Admit: 2024-06-13 | Discharge: 2024-06-13 | Attending: Internal Medicine

## 2024-06-13 ENCOUNTER — Ambulatory Visit
Admission: RE | Admit: 2024-06-13 | Discharge: 2024-06-13 | Disposition: A | Discharge status: Home / Self Care | Source: Ambulatory Visit | Attending: Internal Medicine | Admitting: Internal Medicine

## 2024-06-13 DIAGNOSIS — R928 Other abnormal and inconclusive findings on diagnostic imaging of breast: Secondary | ICD-10-CM

## 2024-06-15 ENCOUNTER — Ambulatory Visit: Payer: Self-pay | Admitting: Internal Medicine

## 2025-05-17 ENCOUNTER — Encounter: Admitting: Internal Medicine
